# Patient Record
Sex: Male | Born: 1955 | Race: White | Hispanic: No | Marital: Married | Smoking: Never smoker
Health system: Southern US, Community
[De-identification: ages and names within clinical notes are randomized; demographics above are authoritative.]

---
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
## 2011-03-14 ENCOUNTER — Ambulatory Visit
Admission: RE | Admit: 2011-03-14 | Discharge: 2011-03-14 | Disposition: A | Discharge status: Home / Self Care | Payer: PRIVATE HEALTH INSURANCE | Source: Ambulatory Visit | Attending: Family Medicine | Admitting: Family Medicine

## 2011-03-14 ENCOUNTER — Ambulatory Visit (INDEPENDENT_AMBULATORY_CARE_PROVIDER_SITE_OTHER): Payer: PRIVATE HEALTH INSURANCE | Admitting: Family Medicine

## 2011-03-14 ENCOUNTER — Encounter: Payer: Self-pay | Admitting: Family Medicine

## 2011-03-14 ENCOUNTER — Telehealth: Payer: Self-pay | Admitting: Family Medicine

## 2011-03-14 VITALS — BP 144/82 | HR 96 | Temp 99.4°F | Ht 72.0 in | Wt 177.0 lb

## 2011-03-14 DIAGNOSIS — E1065 Type 1 diabetes mellitus with hyperglycemia: Secondary | ICD-10-CM

## 2011-03-14 DIAGNOSIS — E119 Type 2 diabetes mellitus without complications: Secondary | ICD-10-CM

## 2011-03-14 DIAGNOSIS — Z125 Encounter for screening for malignant neoplasm of prostate: Secondary | ICD-10-CM

## 2011-03-14 DIAGNOSIS — I10 Essential (primary) hypertension: Secondary | ICD-10-CM

## 2011-03-14 DIAGNOSIS — J189 Pneumonia, unspecified organism: Secondary | ICD-10-CM

## 2011-03-14 DIAGNOSIS — R05 Cough: Secondary | ICD-10-CM

## 2011-03-14 LAB — POCT UA - MICROALBUMIN
Albumin/Creatinine Ratio, Urine, POC: >300
Creatinine, POC: 100 mg/dL
Microalbumin Ur, POC: 150 mg/dL

## 2011-03-14 LAB — POCT GLYCOSYLATED HEMOGLOBIN (HGB A1C): Hemoglobin A1C: 11.7

## 2011-03-14 MED ORDER — INSULIN GLARGINE 100 UNIT/ML ~~LOC~~ SOLN: 10.0000 [IU] | Freq: Every day | SUBCUTANEOUS | Status: DC

## 2011-03-14 MED ORDER — INSULIN LISPRO 100 UNIT/ML ~~LOC~~ SOLN: SUBCUTANEOUS | Status: DC

## 2011-03-14 MED ORDER — DOXYCYCLINE HYCLATE 100 MG PO CAPS: 100.0000 mg | ORAL_CAPSULE | Freq: Two times a day (BID) | ORAL | Status: AC

## 2011-03-14 MED ORDER — INSULIN PEN NEEDLE 31G X 6 MM MISC: Status: DC

## 2011-03-14 NOTE — Patient Instructions (Addendum)
CXR downstairs today. Will call you w/ results tomorrow.  Update fasting labs tomorrow morning if you can.  They open at 8 and you do not need an appt.  Start Lantus 8 units every morning. Use Humalog for sliding scale.  Check sugars: AM fasting 80-120 is goal, before lunch and 2 hrs after dinner.  <150 goal at night.  Call me if any problems.  Return for f/u sugars in 2 wks.  I will start Lisinopril then.

## 2011-03-14 NOTE — Telephone Encounter (Signed)
I called pt with his CXR results, + for pneumonia.  Will treat with a round of Doxycline.  Call if any problems.  Recheck in 4 wks.

## 2011-03-14 NOTE — Progress Notes (Signed)
Subjective:    Patient ID: Troy Barnes, male    DOB: December 14, 1955, 55 y.o.   MRN: [id number]  HPI 55 yo WM presents for T1DM.  He stopped his insulin 3 yrs ago b/c he did not have insurance.  His sugar has been > 400.  He was diagnosed at 55 yrs old.  He was tried on oral meds at the onset but it didn't work.  He has been disoriented lately and coughing.  He has felt a bit nauseated.  Denies SOB or chest tightness.  Denies heart problems.  Does not smoke.  His father had an AMI at 57 and died.  He has polyuria and polydipsia.  He reports having an eye exam this year that was normal.  BP 144/82  Pulse 96  Temp(Src) 99.4 F (37.4 C) (Oral)  Ht 6' (1.829 m)  Wt 177 lb (80.287 kg)  BMI 24.01 kg/m2  SpO2 96%  Past Medical History  Diagnosis Date  . Diabetes mellitus     Type I since his 30s    Past Surgical History  Procedure Date  . Surgery scrotal / testicular 1975    for torsion    Family History  Problem Relation Age of Onset  . Diabetes Mother   . Heart disease Father     History   Social History  . Marital Status: Married    Spouse Name: Paul Dykes    Number of Children: 1  . Years of Education: 12   Occupational History  . manager     [employer]    Social History Main Topics  . Smoking status: Never Smoker   . Smokeless tobacco: Not on file  . Alcohol Use: No  . Drug Use: No  . Sexually Active: Yes   Other Topics Concern  . Not on file   Social History Narrative  . No narrative on file    Not on File  Current outpatient prescriptions:doxycycline (VIBRAMYCIN) 100 MG capsule, Take 1 capsule (100 mg total) by mouth 2 (two) times daily., Disp: 20 capsule, Rfl: 0;  insulin glargine (LANTUS SOLOSTAR) 100 UNIT/ML injection, Inject 10 Units into the skin daily., Disp: 15 mL, Rfl: 0;  insulin lispro (HUMALOG KWIKPEN) 100 UNIT/ML injection, Use for sliding scale insulin, Disp: 10 mL, Rfl: 12 Insulin Pen Needle (COMFORT EZ PEN NEEDLES) 31G X 6 MM MISC, Use 4 x a day  as directed, Disp: 100 each, Rfl: 3    Review of Systems  Constitutional: Positive for fatigue. Negative for fever and chills.  Respiratory: Positive for cough and wheezing.   Gastrointestinal: Positive for nausea. Negative for vomiting and diarrhea.  Genitourinary: Positive for frequency.  Neurological: Positive for weakness. Negative for headaches.  Psychiatric/Behavioral: Positive for confusion and decreased concentration.       Objective:   Physical Exam  Constitutional: He appears well-developed and well-nourished. No distress.  HENT:  Head: Normocephalic and atraumatic.  Right Ear: External ear normal.  Left Ear: External ear normal.  Nose: Nose normal.  Mouth/Throat: Oropharynx is clear and moist.  Eyes: Conjunctivae are normal. No scleral icterus.  Neck: Neck supple. No thyromegaly present.  Cardiovascular: Normal rate, regular rhythm and normal heart sounds.   No murmur heard. Pulmonary/Chest: Effort normal. No respiratory distress. He has rales (RUL and RLL rales with reduced air movement).  Abdominal: Soft. Bowel sounds are normal. He exhibits no distension. There is no tenderness. There is no guarding.  Musculoskeletal: He exhibits no edema.  Lymphadenopathy:    He  has no cervical adenopathy.  Neurological:       No tremor  Skin: Skin is warm and dry.  Psychiatric: He has a normal mood and affect.          Assessment & Plan:

## 2011-03-16 ENCOUNTER — Encounter: Payer: Self-pay | Admitting: Family Medicine

## 2011-03-16 DIAGNOSIS — IMO0002 Reserved for concepts with insufficient information to code with codable children: Secondary | ICD-10-CM | POA: Insufficient documentation

## 2011-03-16 DIAGNOSIS — J189 Pneumonia, unspecified organism: Secondary | ICD-10-CM | POA: Insufficient documentation

## 2011-03-16 DIAGNOSIS — E1065 Type 1 diabetes mellitus with hyperglycemia: Secondary | ICD-10-CM | POA: Insufficient documentation

## 2011-03-16 NOTE — Assessment & Plan Note (Signed)
Poorly controlled, off Insulin x 3 yrs.  Will restart Lantus at 8 units once a day (this was his latest RX dose).  Humalog given for sliding scale with a very sensitive scale given to pt.  Instructed on proper pen use.  He has a meter and test strips.  A1C high at 11 today.  Eye exam UTD but will get old records for vaccine hx.  Umicro HIGH and BP high but will wait for next visit to start an ACEi since he is a bit dehydrated today from polyuria and infection (PNA).  Monofilament next visit.  F/u in 2 wks.  Call if any problems.

## 2011-03-16 NOTE — Assessment & Plan Note (Signed)
R sided pneumonia confirmed on CXR likely the cause for his higher sugar readings, disorientation, cough and nausea.  Will treat him with 10 days of doxycyxline.  Repeat CXR in 3 wks.  RTC for f/u sugars in 2 wks.  Call if any problems, worsening breathing, fevers after 48 hrs.

## 2011-04-03 ENCOUNTER — Ambulatory Visit (INDEPENDENT_AMBULATORY_CARE_PROVIDER_SITE_OTHER): Payer: PRIVATE HEALTH INSURANCE | Admitting: Family Medicine

## 2011-04-03 ENCOUNTER — Encounter: Payer: Self-pay | Admitting: Family Medicine

## 2011-04-03 VITALS — BP 151/89 | HR 93 | Ht 72.0 in | Wt 184.0 lb

## 2011-04-03 DIAGNOSIS — I10 Essential (primary) hypertension: Secondary | ICD-10-CM

## 2011-04-03 DIAGNOSIS — E1065 Type 1 diabetes mellitus with hyperglycemia: Secondary | ICD-10-CM

## 2011-04-03 DIAGNOSIS — E1142 Type 2 diabetes mellitus with diabetic polyneuropathy: Secondary | ICD-10-CM

## 2011-04-03 DIAGNOSIS — E1140 Type 2 diabetes mellitus with diabetic neuropathy, unspecified: Secondary | ICD-10-CM

## 2011-04-03 DIAGNOSIS — E1149 Type 2 diabetes mellitus with other diabetic neurological complication: Secondary | ICD-10-CM

## 2011-04-03 DIAGNOSIS — J189 Pneumonia, unspecified organism: Secondary | ICD-10-CM

## 2011-04-03 MED ORDER — INSULIN GLARGINE 100 UNIT/ML ~~LOC~~ SOLN: 15.0000 [IU] | Freq: Every day | SUBCUTANEOUS | Status: DC

## 2011-04-03 MED ORDER — LISINOPRIL 20 MG PO TABS: 20.0000 mg | ORAL_TABLET | Freq: Every day | ORAL | Status: DC

## 2011-04-03 MED ORDER — INSULIN SYRINGES (DISPOSABLE) U-100 0.3 ML MISC: Status: DC

## 2011-04-03 MED ORDER — INSULIN LISPRO 100 UNIT/ML ~~LOC~~ SOLN: SUBCUTANEOUS | Status: DC

## 2011-04-03 NOTE — Patient Instructions (Signed)
See changes to insulin below.  Repeat CXR downstairs in 10 days.  Order printed. Will call you w/ results.  Return for f/u sugars in 1 month.

## 2011-04-03 NOTE — Progress Notes (Signed)
  Subjective:    Patient ID: Troy Barnes, male    DOB: [birth year], 55 y.o.   MRN: [id number]  HPI  55 yo WM presents for f/u visit.  He had pneumonia at his last visit.  He was put on antibiotics and is starting to feel better.  He had vomitting for the first 3 days.  The then had to fly to [place] to visit his mom who is sick. He is back on insulin.  He is on Lantus 8 units in the morning.  He is on Humalog with breakfast, lunch and dinner (3 units with meals).  His blurry vision is much better.  His sugars are down into the 200s to 300s.  He eats 2, sometimes 3 meals/ day.  No lows. He had just had an eye exam done.  He has had numbness and tingling in both legs for years.  Denies any lows.  He is not taking anything for his BP and has not yet done his labs.  BP 151/89  Pulse 93  Ht 6' (1.829 m)  Wt 184 lb (83.462 kg)  BMI 24.95 kg/m2  SpO2 98%   Review of Systems  Constitutional: Negative for fever, fatigue and unexpected weight change.  Eyes: Negative for visual disturbance.  Respiratory: Negative for cough, chest tightness and shortness of breath.   Cardiovascular: Negative for chest pain and palpitations.  Gastrointestinal: Negative for nausea and abdominal pain.  Genitourinary: Positive for frequency.  Neurological: Negative for headaches.       Objective:   Physical Exam  Constitutional: He appears well-developed and well-nourished.  HENT:  Mouth/Throat: Oropharynx is clear and moist.       Poor dentition  Eyes: Conjunctivae are normal.  Cardiovascular: Normal rate, regular rhythm and normal heart sounds.   Pulmonary/Chest: Effort normal and breath sounds normal. No respiratory distress. He has no wheezes. He has no rales.  Musculoskeletal: He exhibits no edema.  Lymphadenopathy:    He has no cervical adenopathy.  Skin: Skin is warm and dry.  Psychiatric: He has a normal mood and affect.          Assessment & Plan:

## 2011-04-04 DIAGNOSIS — E1140 Type 2 diabetes mellitus with diabetic neuropathy, unspecified: Secondary | ICD-10-CM | POA: Insufficient documentation

## 2011-04-04 DIAGNOSIS — I10 Essential (primary) hypertension: Secondary | ICD-10-CM | POA: Insufficient documentation

## 2011-04-04 NOTE — Assessment & Plan Note (Signed)
Clinically much improved with much improved breath sounds on exam.  Will repeat his CXR in 10 days, order given.

## 2011-04-04 NOTE — Assessment & Plan Note (Signed)
Poor control by home sugars and last A1C, complicated by neuropathy (failed his 10 g monofilament  bilat today).  Will increase his Lantus from 8--> 15 units once daily and go up on Humalog from 3--> 6 units 3 x a day with meals.  encoiuraged regular meals w/o skipping, staying with low carb/ low sugar.  Expect some of his high readings to improve with resolution of pneumonia.  RTC in 1 month.  Call if any readings < 70 or > 400.

## 2011-04-04 NOTE — Assessment & Plan Note (Signed)
BP has been consistently > 140/90.  Will start him on Lisinopril daily.  Update labs ( he has the order).  Plan to repeat his K+/ renal function after [redacted] wks along with a urine microalbumin.  Call if any problems.

## 2011-04-14 ENCOUNTER — Ambulatory Visit
Admission: RE | Admit: 2011-04-14 | Discharge: 2011-04-14 | Disposition: A | Discharge status: Home / Self Care | Payer: PRIVATE HEALTH INSURANCE | Source: Ambulatory Visit | Attending: Family Medicine | Admitting: Family Medicine

## 2011-04-14 ENCOUNTER — Telehealth: Payer: Self-pay | Admitting: Family Medicine

## 2011-04-14 DIAGNOSIS — J189 Pneumonia, unspecified organism: Secondary | ICD-10-CM

## 2011-04-14 NOTE — Telephone Encounter (Signed)
Pt aware of the above  

## 2011-04-14 NOTE — Telephone Encounter (Signed)
Pls let pt know that his Pneumonia has cleared.  He does have a sign of chronic bronchitis.  If he continue to have cough or SOB, i suggest a pulmonology referral.

## 2011-05-03 ENCOUNTER — Ambulatory Visit: Payer: PRIVATE HEALTH INSURANCE | Admitting: Family Medicine

## 2011-08-29 ENCOUNTER — Telehealth: Payer: Self-pay | Admitting: *Deleted

## 2011-08-29 NOTE — Telephone Encounter (Signed)
Need rx for 4 month supply on Humalog for this patient for pt assistance program and 3 month supply for the Lantus pens. I dont know how to figure out quantity for the insulin to last that long. Just print so I can put with papers

## 2011-08-30 MED ORDER — INSULIN GLARGINE 100 UNIT/ML ~~LOC~~ SOLN: 15.0000 [IU] | Freq: Every day | SUBCUTANEOUS | Status: DC

## 2011-08-30 MED ORDER — INSULIN LISPRO 100 UNIT/ML ~~LOC~~ SOLN: SUBCUTANEOUS | Status: DC

## 2011-08-30 NOTE — Telephone Encounter (Signed)
Order printed

## 2011-09-06 ENCOUNTER — Other Ambulatory Visit: Payer: Self-pay | Admitting: Family Medicine

## 2011-09-06 MED ORDER — INSULIN GLARGINE 100 UNIT/ML ~~LOC~~ SOLN: 15.0000 [IU] | Freq: Every day | SUBCUTANEOUS | Status: DC

## 2011-09-06 MED ORDER — INSULIN LISPRO 100 UNIT/ML ~~LOC~~ SOLN: SUBCUTANEOUS | Status: DC

## 2011-10-12 ENCOUNTER — Ambulatory Visit (INDEPENDENT_AMBULATORY_CARE_PROVIDER_SITE_OTHER): Payer: PRIVATE HEALTH INSURANCE | Admitting: Family Medicine

## 2011-10-12 ENCOUNTER — Encounter: Payer: Self-pay | Admitting: Family Medicine

## 2011-10-12 DIAGNOSIS — K2970 Gastritis, unspecified, without bleeding: Secondary | ICD-10-CM

## 2011-10-12 DIAGNOSIS — K2990 Gastroduodenitis, unspecified, without bleeding: Secondary | ICD-10-CM

## 2011-10-12 DIAGNOSIS — I10 Essential (primary) hypertension: Secondary | ICD-10-CM

## 2011-10-12 DIAGNOSIS — E119 Type 2 diabetes mellitus without complications: Secondary | ICD-10-CM

## 2011-10-12 MED ORDER — LISINOPRIL 20 MG PO TABS: 20.0000 mg | ORAL_TABLET | Freq: Every day | ORAL | Status: DC

## 2011-10-12 NOTE — Progress Notes (Signed)
  Subjective:    Patient ID: Troy Barnes, male    DOB: 1955/11/25, 56 y.o.   MRN: [id number]  HPI Having dry heaves and feeling nauseated.  Felt like this back over the summer and was dx with DM. Then ran out of insurance. Since then has been laid off.  Has been off his insulin for awhile.  No fever. Sugars running in 400s.  No cough or SOB.  Dry heaves are worse in the AM.  Still able to eat.  Ankles have been swollen as well to his thighs.  Says always has some swelling but this is more than usual. No change in bowels or blood in teh stool. Wife is here with hime today.    Review of Systems     Objective:   Physical Exam  Constitutional: He is oriented to person, place, and time. He appears well-developed and well-nourished.  HENT:  Head: Normocephalic and atraumatic.  Right Ear: External ear normal.  Left Ear: External ear normal.  Nose: Nose normal.  Mouth/Throat: Oropharynx is clear and moist.       TMs and canals are clear.   Eyes: Conjunctivae and EOM are normal. Pupils are equal, round, and reactive to light.  Neck: Neck supple. No thyromegaly present.  Cardiovascular: Normal rate, regular rhythm and normal heart sounds.   Pulmonary/Chest: Effort normal and breath sounds normal.  Abdominal: Soft. Bowel sounds are normal. He exhibits no distension and no mass. There is no tenderness. There is no rebound and no guarding.  Musculoskeletal:       Bilat 1+ ankle edema.   Lymphadenopathy:    He has no cervical adenopathy.  Neurological: He is alert and oriented to person, place, and time.  Skin: Skin is warm and dry.  Psychiatric: He has a normal mood and affect. His behavior is normal.   Toenails are thick, long and dystrophic.       Assessment & Plan:  DM- Will give insulin samples.  We discussed long term complications of not treating his DM.  He has already applied to the drug company for drug assitance. He really needs a CMP and lipid panel. This exam was performed  today. He is in great need of podiatry for his nails as they are too long and very dystrophic but unfortunately patient does not have insurance right now.  HTN- Restart lisinopril. On $4 list at [facility]. Will send there.  Discussed low salt diet. He says never dx with HTN before. Did eat chinese food last night.   Gastriltis - Will start PPI. Discussed reflux hygiene. Stop sodas.  Given H.O. Call if not better in 2 weeks.

## 2011-10-12 NOTE — Patient Instructions (Signed)
Gastroesophageal Reflux Disease, Adult Gastroesophageal reflux disease (GERD) happens when acid from your stomach flows up into the esophagus. When acid comes in contact with the esophagus, the acid causes soreness (inflammation) in the esophagus. Over time, GERD may create small holes (ulcers) in the lining of the esophagus. CAUSES    Increased body weight. This puts pressure on the stomach, making acid rise from the stomach into the esophagus.     Smoking. This increases acid production in the stomach.     Drinking alcohol. This causes decreased pressure in the lower esophageal sphincter (valve or ring of muscle between the esophagus and stomach), allowing acid from the stomach into the esophagus.     Late evening meals and a full stomach. This increases pressure and acid production in the stomach.     A malformed lower esophageal sphincter.  Sometimes, no cause is found. SYMPTOMS    Burning pain in the lower part of the mid-chest behind the breastbone and in the mid-stomach area. This may occur twice a week or more often.     Trouble swallowing.     Sore throat.     Dry cough.     Asthma-like symptoms including chest tightness, shortness of breath, or wheezing.  DIAGNOSIS   Your caregiver may be able to diagnose GERD based on your symptoms. In some cases, X-rays and other tests may be done to check for complications or to check the condition of your stomach and esophagus. TREATMENT   Your caregiver may recommend over-the-counter or prescription medicines to help decrease acid production. Ask your caregiver before starting or adding any new medicines.   HOME CARE INSTRUCTIONS    Change the factors that you can control. Ask your caregiver for guidance concerning weight loss, quitting smoking, and alcohol consumption.     Avoid foods and drinks that make your symptoms worse, such as:     Caffeine or alcoholic drinks.     Chocolate.    Peppermint or mint flavorings.     Garlic  and onions.     Spicy foods.     Citrus fruits, such as oranges, lemons, or limes.     Tomato-based foods such as sauce, chili, salsa, and pizza.     Fried and fatty foods.     Avoid lying down for the 3 hours prior to your bedtime or prior to taking a nap.     Eat small, frequent meals instead of large meals.     Wear loose-fitting clothing. Do not wear anything tight around your waist that causes pressure on your stomach.     Raise the head of your bed 6 to 8 inches with wood blocks to help you sleep. Extra pillows will not help.     Only take over-the-counter or prescription medicines for pain, discomfort, or fever as directed by your caregiver.     Do not take aspirin, ibuprofen, or other nonsteroidal anti-inflammatory drugs (NSAIDs).  SEEK IMMEDIATE MEDICAL CARE IF:    You have pain in your arms, neck, jaw, teeth, or back.     Your pain increases or changes in intensity or duration.     You develop nausea, vomiting, or sweating (diaphoresis).     You develop shortness of breath, or you faint.     Your vomit is green, yellow, black, or looks like coffee grounds or blood.     Your stool is red, bloody, or black.  These symptoms could be signs of other problems, such as heart  disease, gastric bleeding, or esophageal bleeding. MAKE SURE YOU:    Understand these instructions.     Will watch your condition.     Will get help right away if you are not doing well or get worse.  Document Released: 07/05/2005 Document Revised: 06/07/2011 Document Reviewed: 04/14/2011 [employer] Patient Information 2012 [employer].1.5 Gram Low Sodium Diet A 1.5 gram sodium diet restricts the amount of sodium in the diet to no more than 1.5 g or 1500 mg daily. The American Heart Association recommends Americans over the age of 30 to consume no more than 1500 mg of sodium each day to reduce the risk of developing high blood pressure. Research also shows that limiting sodium may reduce heart  attack and stroke risk. Many foods contain sodium for flavor and sometimes as a preservative. When the amount of sodium in a diet needs to be low, it is important to know what to look for when choosing foods and drinks. The following includes some information and guidelines to help make it easier for you to adapt to a low sodium diet. QUICK TIPS  Do not add salt to food.     Avoid convenience items and fast food.     Choose unsalted snack foods.     Buy lower sodium products, often labeled as "lower sodium" or "no salt added."     Check food labels to learn how much sodium is in 1 serving.     When eating at a restaurant, ask that your food be prepared with less salt or none, if possible.  READING FOOD LABELS FOR SODIUM INFORMATION The nutrition facts label is a good place to find how much sodium is in foods. Look for products with no more than 400 mg of sodium per serving. Remember that 1.5 g = 1500 mg. The food label may also list foods as:  Sodium-free: Less than 5 mg in a serving.     Very low sodium: 35 mg or less in a serving.     Low-sodium: 140 mg or less in a serving.     Light in sodium: 50% less sodium in a serving. For example, if a food that usually has 300 mg of sodium is changed to become light in sodium, it will have 150 mg of sodium.     Reduced sodium: 25% less sodium in a serving. For example, if a food that usually has 400 mg of sodium is changed to reduced sodium, it will have 300 mg of sodium.  CHOOSING FOODS Grains  Avoid: Salted crackers and snack items. Some cereals, including instant hot cereals. Bread stuffing and biscuit mixes. Seasoned rice or pasta mixes.     Choose: Unsalted snack items. Low-sodium cereals, oats, puffed wheat and rice, shredded wheat. English muffins and bread. Pasta.  Meats  Avoid: Salted, canned, smoked, spiced, pickled meats, including fish and poultry. Bacon, ham, sausage, cold cuts, hot dogs, anchovies.     Choose: Low-sodium  canned tuna and salmon. Fresh or frozen meat, poultry, and fish.  Dairy  Avoid: Processed cheese and spreads. Cottage cheese. Buttermilk and condensed milk. Regular cheese.     Choose: Milk. Low-sodium cottage cheese. Yogurt. Sour cream. Low-sodium cheese.  Fruits and Vegetables  Avoid: Regular canned vegetables. Regular canned tomato sauce and paste. Frozen vegetables in sauces. Olives. Rosita Fire. Relishes. Sauerkraut.     Choose: Low-sodium canned vegetables. Low-sodium tomato sauce and paste. Frozen or fresh vegetables. Fresh and frozen fruit.  Condiments  Avoid: Canned and packaged  gravies. Worcestershire sauce. Tartar sauce. Barbecue sauce. Soy sauce. Steak sauce. Ketchup. Onion, garlic, and table salt. Meat flavorings and tenderizers.     Choose: Fresh and dried herbs and spices. Low-sodium varieties of mustard and ketchup. Lemon juice. Tabasco sauce. Horseradish.  SAMPLE 1.5 GRAM SODIUM MEAL PLAN Breakfast / Sodium (mg)  1 cup low-fat milk / 143 mg     1 whole-wheat English muffin / 240 mg     1 tbs heart-healthy margarine / 153 mg     1 hard-boiled egg / 139 mg     1 small orange / 0 mg  Lunch / Sodium (mg)  1 cup raw carrots / 76 mg     2 tbs no salt added peanut butter / 5 mg     2 slices whole-wheat bread / 270 mg     1 tbs jelly / 6 mg      cup red grapes / 2 mg  Dinner / Sodium (mg)  1 cup whole-wheat pasta / 2 mg     1 cup low-sodium tomato sauce / 73 mg     3 oz lean ground beef / 57 mg     1 small side salad (1 cup raw spinach leaves,  cup cucumber,  cup yellow bell pepper) with 1 tsp olive oil and 1 tsp red wine vinegar / 25 mg  Snack / Sodium (mg)  1 container low-fat vanilla yogurt / 107 mg     3 graham cracker squares / 127 mg  Nutrient Analysis  Calories: 1745     Protein: 75 g     Carbohydrate: 237 g     Fat: 57 g     Sodium: 1425 mg  Document Released: 09/25/2005 Document Revised: 06/07/2011 Document Reviewed:  12/27/2009 [facility] Patient Information 2012 [place], [place].

## 2011-11-14 ENCOUNTER — Ambulatory Visit (INDEPENDENT_AMBULATORY_CARE_PROVIDER_SITE_OTHER): Payer: PRIVATE HEALTH INSURANCE | Admitting: Physician Assistant

## 2011-11-14 ENCOUNTER — Encounter: Payer: Self-pay | Admitting: Physician Assistant

## 2011-11-14 VITALS — BP 172/89 | HR 63 | Wt 204.0 lb

## 2011-11-14 DIAGNOSIS — I10 Essential (primary) hypertension: Secondary | ICD-10-CM

## 2011-11-14 DIAGNOSIS — E119 Type 2 diabetes mellitus without complications: Secondary | ICD-10-CM

## 2011-11-14 DIAGNOSIS — R600 Localized edema: Secondary | ICD-10-CM

## 2011-11-14 DIAGNOSIS — R609 Edema, unspecified: Secondary | ICD-10-CM

## 2011-11-14 MED ORDER — INSULIN ASPART 100 UNIT/ML ~~LOC~~ SOLN: 4.0000 [IU] | Freq: Three times a day (TID) | SUBCUTANEOUS | Status: DC

## 2011-11-14 MED ORDER — LISINOPRIL-HYDROCHLOROTHIAZIDE 20-12.5 MG PO TABS: 1.0000 | ORAL_TABLET | Freq: Two times a day (BID) | ORAL | Status: DC

## 2011-11-14 NOTE — Progress Notes (Signed)
  Subjective:    Patient ID: Troy Barnes, male    DOB: 1956/03/31, 56 y.o.   MRN: [id number]  HPI Patient presents to clinic complaining of legs swelling. He has noticed this swelling before but it has recently(over the past 2 weeks) gotten worse. He reports that both legs all the way up to his groin feel tight. Last week he could not bend his legs or put on his pants. He had read that humolog can cause swelling so on Friday he stopped his humulog. He does report swelling did get better. Since Friday he has only been on Lantus 15 units at night. He reports his sugars have not changed much and were tested in the 240's before he ate and 384 after he ate. Denies ever experiencing any lows. He has taken lisinopril daily since prescribed last visit. Blood pressure has decreased some but is still elevated today. He has also been having headaches and just not feeling well.   Patient does not have insurance. He did not get blood work done because he can't afford it. He has a potential opportunity for a job very soon. He states he will get blood work when he has insurance.      Review of Systems       Objective:   Physical Exam  Constitutional: He is oriented to person, place, and time. He appears well-developed and well-nourished.  HENT:  Head: Normocephalic and atraumatic.  Cardiovascular: Normal rate, regular rhythm, normal heart sounds and intact distal pulses.   Pulmonary/Chest: Effort normal and breath sounds normal. He has no wheezes.  Neurological: He is alert and oriented to person, place, and time.  Skin:       2+pitting edema bilateral legs up to knees. No erythema or tenderness. Pedal pulses intact bilaterally.   Psychiatric: He has a normal mood and affect. His behavior is normal.          Assessment & Plan:  Bilateral leg swelling- Started him on Lisinopil-HCTZ combo 20/12.5 BID to help with blood pressure and fluid retention. Need to check K+ and Cr. Patient states in March at  follow up he will do labs.   Diabetes- Changed Humolog to Novolog 4 units before each meal. Continue on Lantus 15 mg at bedtime. Encouraged patient to check sugar in the morning fasting and 2 hours after both meals and before bedtime. Keep log and bring to office in 2 weeks. Need to recheck HgA1C in March.   HTN- Started to Lisinopril/HCTZ BID. Encouraged patient to go to pharmacy and check blood pressure a couple times a week and record it. Will recheck in March.   No insurance. Denied pneumo shot and colonoscopy at this time. Gave sample of novolog.

## 2011-11-14 NOTE — Patient Instructions (Addendum)
Start Novolog 4 units at meal time. Continue lantus 15 units at bedtime. Start lisinopril/hctz 20/12/5 twice a day for bp control and swelling. Test blood glucose fasting and 2 hrs after every meal and at bedtime. Please record and bring to office. Check blood pressure and record and also bring in. Follow in March.

## 2012-01-16 IMAGING — CR DG CHEST 2V
2 series · 2 of 2 positions shown · non-contrast
Comparison: [HOSPITAL] [HOSPITAL] chest x-ray 03/14/2011.

CLINICAL DATA: Follow up pneumonia, nonsmoker, cough.

CHEST - 2 VIEW

[view not recorded (1 of 2)]
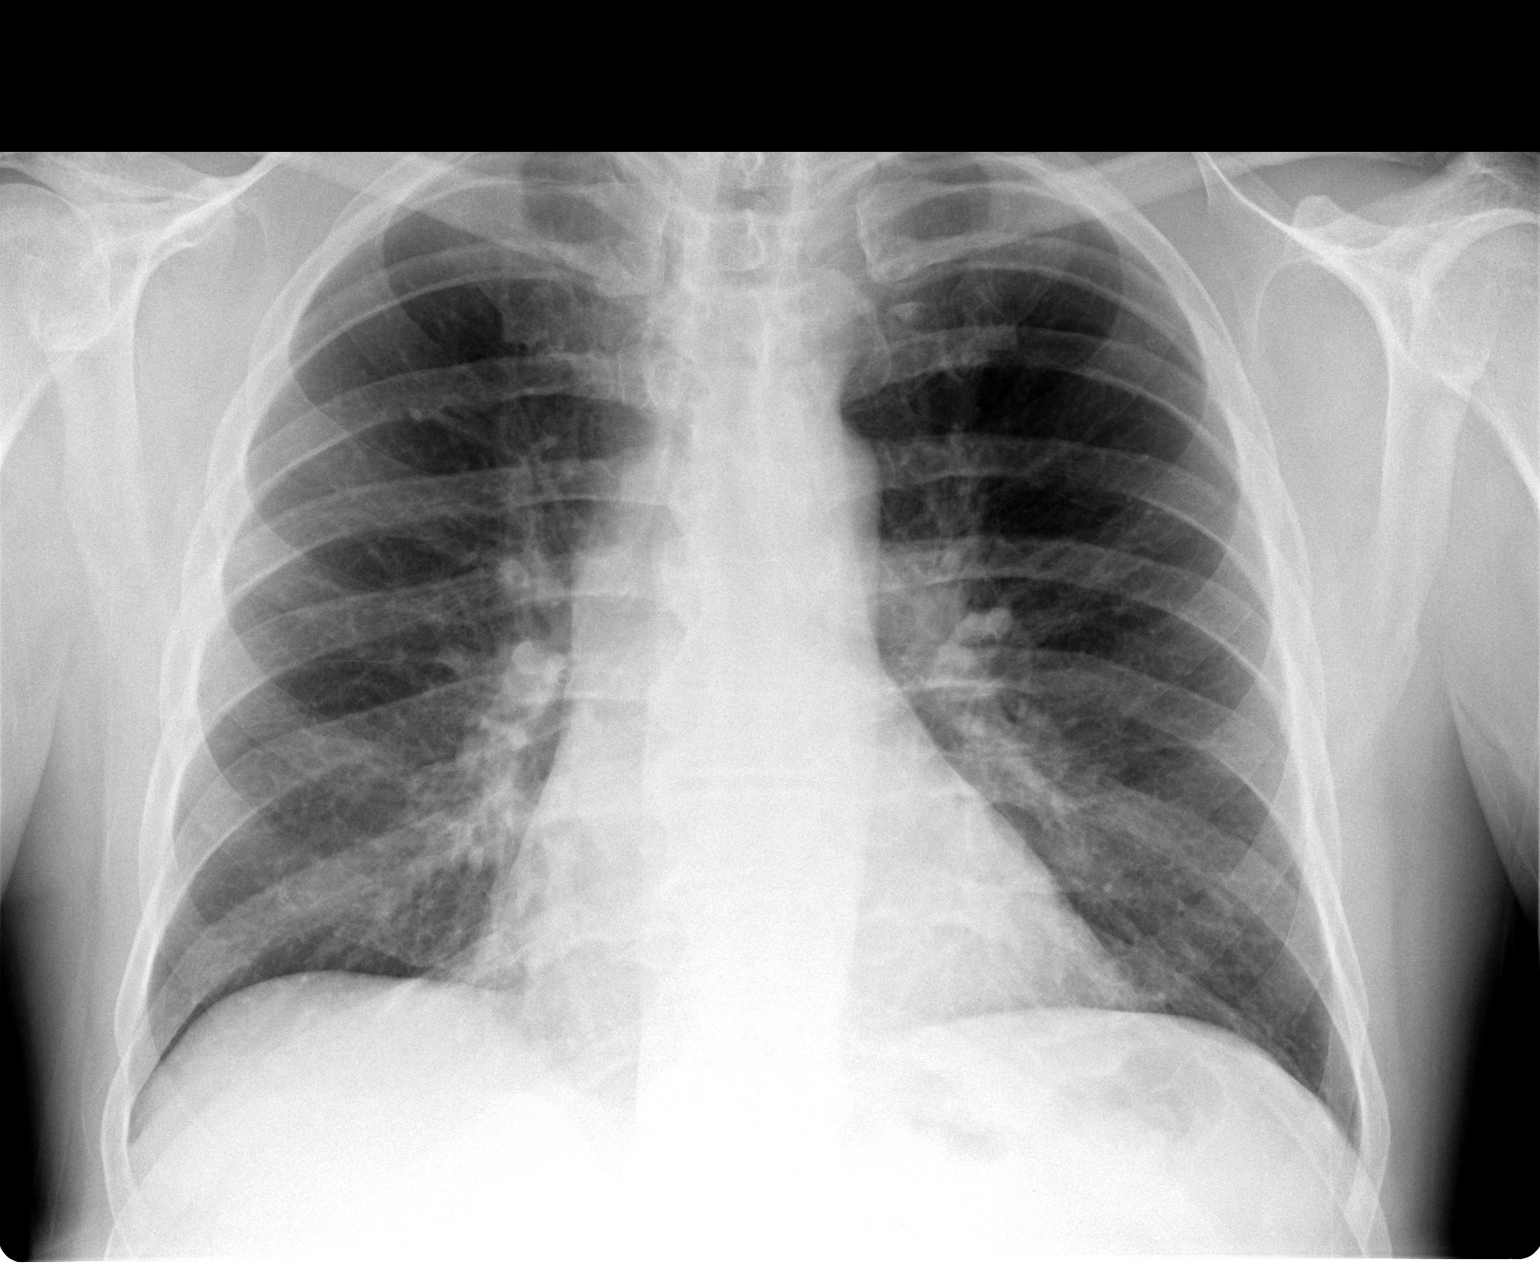

[view not recorded (2 of 2)]
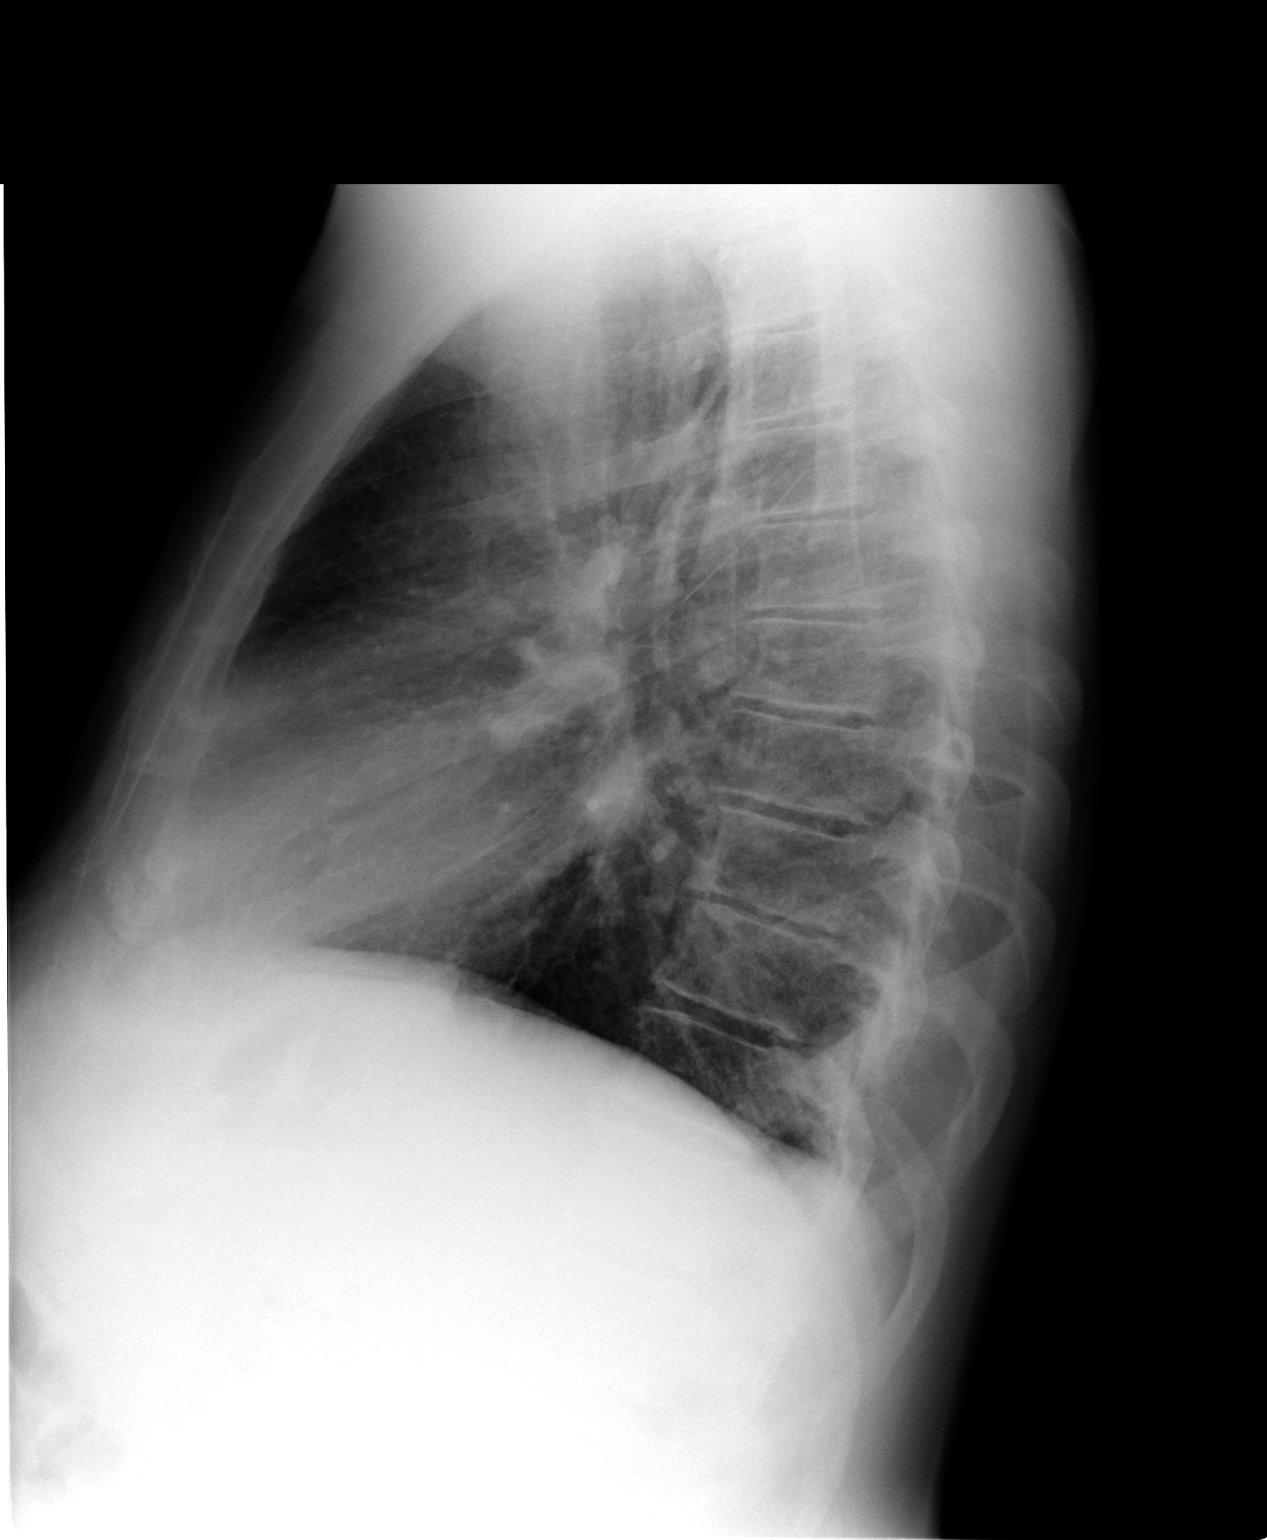

[2 of 2 positions shown; findings below may reference images not displayed]

FINDINGS: Since 03/14/2011, complete clearing previous right upper
lung infiltrate.

Stable slight central airway thickening seen with lungs otherwise
clear.

Heart size normal.

Mediastinum, hila, pleura and osseous structures are stable and
unremarkable.
IMPRESSION: 1.  Interval clearing previous right upper lung pneumonia.
2.  Stable slight central airway thickening of asthma or chronic
bronchitis.
3.  No active disease.

## 2012-06-19 ENCOUNTER — Ambulatory Visit (INDEPENDENT_AMBULATORY_CARE_PROVIDER_SITE_OTHER): Payer: PRIVATE HEALTH INSURANCE | Admitting: Physician Assistant

## 2012-06-19 ENCOUNTER — Encounter: Payer: Self-pay | Admitting: Physician Assistant

## 2012-06-19 VITALS — BP 178/94 | HR 92 | Ht 72.0 in | Wt 213.0 lb

## 2012-06-19 DIAGNOSIS — I10 Essential (primary) hypertension: Secondary | ICD-10-CM

## 2012-06-19 DIAGNOSIS — E119 Type 2 diabetes mellitus without complications: Secondary | ICD-10-CM

## 2012-06-19 LAB — POCT URINALYSIS DIPSTICK
Glucose, UA: 500
Spec Grav, UA: 1.025
Urobilinogen, UA: 0.2

## 2012-06-19 LAB — COMPREHENSIVE METABOLIC PANEL
ALT: 32 U/L (ref 0–53)
Alkaline Phosphatase: 163 U/L — ABNORMAL HIGH (ref 39–117)
CO2: 22 mEq/L (ref 19–32)
Sodium: 137 mEq/L (ref 135–145)
Total Bilirubin: 0.4 mg/dL (ref 0.3–1.2)
Total Protein: 5.2 g/dL — ABNORMAL LOW (ref 6.0–8.3)

## 2012-06-19 LAB — POCT GLYCOSYLATED HEMOGLOBIN (HGB A1C): Hemoglobin A1C: 11.2

## 2012-06-19 NOTE — Progress Notes (Signed)
Subjective:    Patient ID: Troy Barnes, male    DOB: 12-Sep-1956, 56 y.o.   MRN: [id number]  HPI Patient is a 56 year old male who presents to the clinic with diabetes and hypertension. He has not been seen in the office in over her 6 months. He lost his job and being and has not been taking any of his diabetic medications for her blood pressure medications due to cost. When questioned if he could afford $4 lisinopril/HCTZ he said there is absolutely no money for medications. Patient is not in a good state today. His vision is increasingly worsening in both eyes. He sees black dots continually. He has bilateral leg edema. He denies any chest pain, palpitations, shortness of breath. He is still able to walk but does not have any filling in his feet. He has been trying to not eat any sugary foods. He denies any urinary urgency or frequency.   Review of Systems     Objective:   Physical Exam  Constitutional: He is oriented to person, place, and time. He appears well-developed and well-nourished.  HENT:  Head: Normocephalic and atraumatic.       Poor dentition.  Neck: Normal range of motion. Neck supple.  Cardiovascular: Normal rate, regular rhythm, normal heart sounds and intact distal pulses.   Pulmonary/Chest: Effort normal and breath sounds normal. He has no wheezes.  Neurological: He is alert and oriented to person, place, and time.       He was not able to fill monofilament test in any position. No sores or lesions were found on his feet.  Skin:       Bilateral 2+ pitting edema up to knee.  Psychiatric:       He was very down today. While discussing prognosis and what he needed to do he became tearful.          Assessment & Plan:  Diabetes-UA positive for blood, protein, glucose. Hemoglobin A1c was 11.3. A1c is actually down from last recheck. He had a long discussion about the importance of controlling his blood glucose. We talked about the effects of elevated glucose such as  blindness, kidney failure, though your sores to heal. I know that financially he is going through a rough time. I encouraged him to try to make an appointment with the free clinic such as [facility]. I'm aware that there is a waiting list to get into with [facility] quite a; therefore, we will work with him here with samples and trying to get drug assistance from the pharmaceutical companies. Patient was told to present off forms for Lantus and NovoLog refill on his part and we would fill out our part. I told him to start Lantus 20 units at bedtime and then I gave him a sliding-scale for rapid insulin. Reminded him to check his fasting sugar in the morning and then before each meals. 3 strips were given to patient along with samples of Lantus and NovoLog. I need to see him back in 3 months to see the progress. If he can get in with [facility] that would be on some and they can manage his diabetes from there. Encouraged him to go to ophthalmologist to have his eyes examined.  Hypertension-I gave patient samples of Benicar HCT 40/12.5 to take daily. I need him to check his blood pressure at local pharmacy and call in with number in the next 2 weeks to make sure he is at an adequate dose. I encouraged  him if he could find a way to afford a $4 prescription for lisinopril to do that. Lisinopril HCTZ has been working for him in the past.

## 2012-06-19 NOTE — Patient Instructions (Signed)
Gave patient sliding scale insulin reminders. Patient is discharged 20 units of Lantus every night and rapid acting insulin before each male. Check fasting sugars and before each male. Start Benicar/HCT daily. Followup in 2 weeks with blood pressure reading. Call [facility] [place] and see if he can be seen there.

## 2012-07-08 ENCOUNTER — Ambulatory Visit (INDEPENDENT_AMBULATORY_CARE_PROVIDER_SITE_OTHER): Payer: Self-pay | Admitting: Physician Assistant

## 2012-07-08 ENCOUNTER — Encounter: Payer: Self-pay | Admitting: Physician Assistant

## 2012-07-08 VITALS — BP 182/91 | HR 89 | Ht 72.0 in | Wt 214.0 lb

## 2012-07-08 DIAGNOSIS — R609 Edema, unspecified: Secondary | ICD-10-CM

## 2012-07-08 DIAGNOSIS — E1065 Type 1 diabetes mellitus with hyperglycemia: Secondary | ICD-10-CM

## 2012-07-08 DIAGNOSIS — I10 Essential (primary) hypertension: Secondary | ICD-10-CM

## 2012-07-08 NOTE — Patient Instructions (Addendum)
[facility] [phone]. You need some assistance with your health care.   Please feel out your paperwork to hopefully get assistance with medication. Please bring in paperwork and we will fax it.

## 2012-07-08 NOTE — Progress Notes (Signed)
  Subjective:    Patient ID: Troy Barnes, male    DOB: 09/19/1956, 56 y.o.   MRN: [id number]  HPI Patient presents to the clinic to follow up on diabetes and hypertension. At last visit I gave him sample of Lantus and Novolog to use before bed and with sliding scale insulin. His sugars are much better they have been running in the 110 fasting. He did use sample of Benicar but ran out and then used old left over blood pressure medication. Denies any CP, palpitations, SOB. His leg continue to swell. Worse at night and better in the morning. He does try to watch what he eats. His vision is stable but still cannot see very far and things are very fuzzy. [facility] or been seen by eye doc.    Review of Systems     Objective:   Physical Exam  Constitutional: He is oriented to person, place, and time. He appears well-developed and well-nourished.  HENT:  Head: Normocephalic and atraumatic.  Cardiovascular: Normal rate and normal heart sounds.   Pulmonary/Chest: Effort normal and breath sounds normal.  Neurological: He is alert and oriented to person, place, and time.  Skin:       Bilateral lower leg edema 2+. No skin changes or decomposition. No erythema or color changes.   Psychiatric: He has a normal mood and affect. His behavior is normal.          Assessment & Plan:  Diabetes type I- Pt has enough lantus but did give more novolog. Encourage patient to call [facility] today. Gave him number. Also gave paperwork to get approved for financial assistance all he needs to do is get tax records. Keep watching your diet. Encouraged him to find a opthalmologic to have eyes evaluated. Denies all vaccines due to cost.  HTN/edema- Gave samples of Benicar 40/25. Gave form to provide [place] with info to send to company for assistance. BP elevated today before we request meds need to see if gets BP lower. BP recheck with nurse visit in 2 weeks. Hopefully the increase in  diuretic will help with swelling. Educated patient about elevation of feet and compression. He could get OTC compression stockings.

## 2012-07-15 ENCOUNTER — Ambulatory Visit: Payer: Self-pay

## 2012-07-15 ENCOUNTER — Encounter: Payer: Self-pay | Admitting: Physician Assistant

## 2012-07-15 ENCOUNTER — Ambulatory Visit (INDEPENDENT_AMBULATORY_CARE_PROVIDER_SITE_OTHER): Payer: Self-pay | Admitting: Physician Assistant

## 2012-07-15 VITALS — BP 185/95 | HR 90 | Wt 227.0 lb

## 2012-07-15 DIAGNOSIS — I10 Essential (primary) hypertension: Secondary | ICD-10-CM

## 2012-07-15 DIAGNOSIS — Z23 Encounter for immunization: Secondary | ICD-10-CM

## 2012-07-15 DIAGNOSIS — R609 Edema, unspecified: Secondary | ICD-10-CM

## 2012-07-15 MED ORDER — FUROSEMIDE 20 MG PO TABS: 20.0000 mg | ORAL_TABLET | Freq: Every day | ORAL | Status: DC

## 2012-07-15 MED ORDER — METOPROLOL TARTRATE 25 MG PO TABS: 25.0000 mg | ORAL_TABLET | Freq: Two times a day (BID) | ORAL | Status: DC

## 2012-07-15 MED ORDER — OLMESARTAN MEDOXOMIL 40 MG PO TABS: 40.0000 mg | ORAL_TABLET | Freq: Every day | ORAL | Status: DC

## 2012-07-15 NOTE — Patient Instructions (Addendum)
Lasix for edema.  Metoprolol for Blood pressure.   Follow up in 2 weeks.

## 2012-07-15 NOTE — Progress Notes (Signed)
  Subjective:    Patient ID: Troy Barnes, male    DOB: [birth year], 56 y.o.   MRN: [id number]  HPI   Pt denies chest pain, SOB, dizziness, or heart palpitations.  Taking meds as directed w/o problems.  Denies medication side effects.still retaining fluid.  5 min spent with pt.  Patient came in for blood pressure recheck; however, blood pressure still remains very elevated today despite patient compliance on Benicar HCT 40/25. He does deny any cardiac symptoms. His legs continue to swell bilaterally. He reports he elevating them as much as he can but he denies been able to get him into compression stockings. He does report that his sugars are much better following the sliding scale insulin. His morning fasting sugar was 102 this pain. He is unable to walk long distances due to leg swelling and heaviness.    Review of Systems     Objective:   Physical Exam  Constitutional: He is oriented to person, place, and time. He appears well-developed and well-nourished.  HENT:  Head: Normocephalic and atraumatic.  Cardiovascular: Normal rate, regular rhythm, normal heart sounds and intact distal pulses.   Pulmonary/Chest: Effort normal and breath sounds normal. He has no wheezes.  Neurological: He is alert and oriented to person, place, and time.  Skin:       Bilateral lower leg edema 2+ pitting edema from ankles to mid calf. Some erythema on anterior legs with some scaling.   Psychiatric: He has a normal mood and affect. His behavior is normal.          Assessment & Plan:  Bilateral lower leg edema-I. did give Lasix 20 mg to take daily. Discussed with patient if he cannot use compression stockings to get ace bandages and a wrap leg elevated as much as possible.  Hypertension-we'll continue to give patient samples of Benicar and not Benicar/HCT since we are adding Lasix and we'll add metoprolol 25 mg twice a day. Will recheck in 1 week blood pressure to see if decreasing any. Will consider  Tribenzor if not improving.  Did fill out DMV handicap form so he could get parking sticker.  Flu shot given today.

## 2012-07-17 ENCOUNTER — Telehealth: Payer: Self-pay | Admitting: Physician Assistant

## 2012-07-17 NOTE — Telephone Encounter (Signed)
Please call patient and find out if swelling is improved.

## 2012-07-17 NOTE — Telephone Encounter (Signed)
Will recheck next week may consider labs or changing bP meds again if BP not improving.

## 2012-07-17 NOTE — Telephone Encounter (Signed)
Pt states swelling is a little bit better but still very swollen.

## 2012-07-20 NOTE — Progress Notes (Signed)
This encounter was created in error - please disregard.

## 2012-07-29 ENCOUNTER — Ambulatory Visit (INDEPENDENT_AMBULATORY_CARE_PROVIDER_SITE_OTHER): Payer: Self-pay | Admitting: Physician Assistant

## 2012-07-29 VITALS — BP 168/82 | HR 83

## 2012-07-29 DIAGNOSIS — I10 Essential (primary) hypertension: Secondary | ICD-10-CM

## 2012-07-29 MED ORDER — LISINOPRIL 40 MG PO TABS: 40.0000 mg | ORAL_TABLET | Freq: Every day | ORAL | Status: DC

## 2012-07-29 NOTE — Progress Notes (Signed)
Patient ID: Troy Barnes, male   DOB: 05/30/1956, 56 y.o.   MRN: [id number]  Pt denies chest pain, SOB, dizziness, or heart palpitations.  Not taking benicar.  Denies medication side effects.  I did give Pt samples of novolog and lantus. 5 min spent with pt.                 Pt has not been taking benicar as he was supposed to. He was not approved for financial assistance for Benicar. I sent over Lisinopril since this should be relatively cheap. He tolerated this well last time that he tried. Lisinopril 40mg  sent to pharmacy. Recheck BP in 2 weeks. Tandy Gaw PA-C

## 2012-08-02 LAB — CBC AND DIFFERENTIAL: Hemoglobin: 10 g/dL — AB (ref 13.5–17.5)

## 2012-08-02 LAB — BASIC METABOLIC PANEL: Sodium: 143 mmol/L (ref 137–147)

## 2012-08-03 LAB — PROTIME-INR: Protime: 11.3 seconds (ref 10.0–13.8)

## 2012-08-03 LAB — POCT INR

## 2012-08-07 ENCOUNTER — Encounter: Payer: Self-pay | Admitting: Physician Assistant

## 2012-08-07 DIAGNOSIS — E113599 Type 2 diabetes mellitus with proliferative diabetic retinopathy without macular edema, unspecified eye: Secondary | ICD-10-CM | POA: Insufficient documentation

## 2012-08-07 DIAGNOSIS — H2510 Age-related nuclear cataract, unspecified eye: Secondary | ICD-10-CM | POA: Insufficient documentation

## 2012-08-07 DIAGNOSIS — E11311 Type 2 diabetes mellitus with unspecified diabetic retinopathy with macular edema: Secondary | ICD-10-CM | POA: Insufficient documentation

## 2012-08-14 ENCOUNTER — Encounter: Payer: Self-pay | Admitting: Physician Assistant

## 2012-08-14 ENCOUNTER — Ambulatory Visit (INDEPENDENT_AMBULATORY_CARE_PROVIDER_SITE_OTHER): Payer: Self-pay | Admitting: Physician Assistant

## 2012-08-14 VITALS — BP 140/72 | HR 74 | Ht 72.0 in | Wt 224.0 lb

## 2012-08-14 DIAGNOSIS — N189 Chronic kidney disease, unspecified: Secondary | ICD-10-CM

## 2012-08-14 DIAGNOSIS — E1140 Type 2 diabetes mellitus with diabetic neuropathy, unspecified: Secondary | ICD-10-CM

## 2012-08-14 DIAGNOSIS — E1142 Type 2 diabetes mellitus with diabetic polyneuropathy: Secondary | ICD-10-CM

## 2012-08-14 DIAGNOSIS — R609 Edema, unspecified: Secondary | ICD-10-CM

## 2012-08-14 DIAGNOSIS — E1149 Type 2 diabetes mellitus with other diabetic neurological complication: Secondary | ICD-10-CM

## 2012-08-14 DIAGNOSIS — Z79899 Other long term (current) drug therapy: Secondary | ICD-10-CM

## 2012-08-14 DIAGNOSIS — R600 Localized edema: Secondary | ICD-10-CM

## 2012-08-14 DIAGNOSIS — E11311 Type 2 diabetes mellitus with unspecified diabetic retinopathy with macular edema: Secondary | ICD-10-CM

## 2012-08-14 DIAGNOSIS — I10 Essential (primary) hypertension: Secondary | ICD-10-CM

## 2012-08-14 DIAGNOSIS — I509 Heart failure, unspecified: Secondary | ICD-10-CM

## 2012-08-14 MED ORDER — INSULIN GLARGINE 100 UNIT/ML ~~LOC~~ SOLN: 20.0000 [IU] | Freq: Every day | SUBCUTANEOUS | Status: DC

## 2012-08-14 NOTE — Patient Instructions (Signed)
Get una boot taken off at urgent care on Saturday. Get blood work to recheck kidneys. May wrap once more and then follow up with me 3 days later. Stay on all medications as directed.

## 2012-08-17 ENCOUNTER — Encounter: Payer: Self-pay | Admitting: Emergency Medicine

## 2012-08-17 ENCOUNTER — Emergency Department
Admission: EM | Admit: 2012-08-17 | Discharge: 2012-08-17 | Disposition: A | Discharge status: Home / Self Care | Payer: Self-pay | Source: Home / Self Care

## 2012-08-17 DIAGNOSIS — R600 Localized edema: Secondary | ICD-10-CM

## 2012-08-17 DIAGNOSIS — R609 Edema, unspecified: Secondary | ICD-10-CM

## 2012-08-17 NOTE — ED Provider Notes (Signed)
History     CSN: [id number]  Arrival date & time 08/17/12  1118   None     No chief complaint on file.   (Consider location/radiation/quality/duration/timing/severity/associated sxs/prior treatment) HPI See below for details of nursing visit:  Past Medical History  Diagnosis Date  . Diabetes mellitus     Type I since his 30s    Past Surgical History  Procedure Date  . Surgery scrotal / testicular 1975    for torsion    Family History  Problem Relation Age of Onset  . Diabetes Mother   . Heart disease Father     History  Substance Use Topics  . Smoking status: Never Smoker   . Smokeless tobacco: Not on file  . Alcohol Use: No      Review of Systems  Allergies  Review of patient's allergies indicates no known allergies.  Home Medications   Current Outpatient Rx  Name  Route  Sig  Dispense  Refill  . ASPIRIN 81 MG PO TABS   Oral   Take 81 mg by mouth daily.         Marland Kitchen CARVEDILOL 25 MG PO TABS   Oral   Take 25 mg by mouth 2 (two) times daily with a meal.         . FUROSEMIDE 20 MG PO TABS   Oral   Take 1 tablet (20 mg total) by mouth daily.   30 tablet   1   . HYDRALAZINE HCL 25 MG PO TABS   Oral   Take 25 mg by mouth 3 (three) times daily.         . INSULIN ASPART 100 UNIT/ML [place] SOLN   Subcutaneous   Inject 4 Units into the skin 3 (three) times daily before meals.   1 pen   12   . INSULIN GLARGINE 100 UNIT/ML [place] SOLN   Subcutaneous   Inject 20 Units into the skin daily.   21 mL   1     There were no vitals taken for this visit.  Physical Exam  ED Course  Procedures (including critical care time)  Labs Reviewed - No data to display No results found.   1. Lower extremity edema       MDM   This is a nursing visit.  The patient presents by his PCPs recommendations to have his Unna boot removed today.  It was done so by the nurse.  He has no signs of infection and no drainage.  He does have pitting edema however per  patient it has improved and he has lost a few pounds, likely due to the Lasix that he is on.  We did not replace the boot because I do not believe that they're necessary today but given him wound precautions and to make sure that he checks his feet a few times a day and also elevates his feet as well.  H will followup with his PCP in 3 days.  He does not have insurance nor can afford a co-pay today so we did this is a nursing visit free of charge.   Marlaine Hind, MD 08/17/12 1154

## 2012-08-17 NOTE — Progress Notes (Signed)
  Subjective:    Patient ID: Troy Barnes, male    DOB: [birth year], 56 y.o.   MRN: [id number]  HPI Patient presents to the clinic to follow up from hospital for lower extremity edema bilaterally. At the hospital he was diagnosed with CHF. His blood pressure medication was changed to Coreg and added hydralaxine. He continues on lasix but instead of PRN he is now taking daily. His sugars were very well controlled in hospital and on same regimen.  Labs at ER: CBC anemia, UA blood, protein, and glucose found, Ca low, Cr and BUN elevated, CK elevated, BNP extremely high, Doppler of extremities negative for clots, CXR normal.   DX was CHF with lower extremity edema. He was treated with IV lasix. He feels some better today. His blood pressure is much more controlled today. He denies any CP, palpitations, SOB. His legs continue to swell. He tries to elevate them as much as possible. He cannot get compression stockings on. He takes lasix 20mg  daily.   Pt is going to have laser  Treatment from eye doctor to help treat diabetic changes to eys.    Review of Systems     Objective:   Physical Exam  Constitutional: He is oriented to person, place, and time. He appears well-developed and well-nourished.  HENT:  Head: Normocephalic and atraumatic.  Neck: No JVD present.  Cardiovascular: Normal rate, regular rhythm and normal heart sounds.   Pulmonary/Chest: Effort normal and breath sounds normal.  Neurological: He is alert and oriented to person, place, and time.  Skin: Skin is dry.       2+ BILATERAL leg edema up to knees. Scaly skin with some erythema. NO open sores           Assessment & Plan:  CHF/Lower extremity edema/Chronic kidney diease/diabetes type II-I had checked a BNP before and normal. LIkely chronic renal disease cause CHF and has created lower extremity edema resistent to treatment. Will check BMP today and recheck kidney function and see if we can up his lasix. Placed pt in bilateral  una boot. Come back in 3 days to UC to have boot taken off. If not improved may apply another boot. Continue to keep elevated. Recheck next Tuesday. Continue diabetes regimen. BP looks much better today. Treatment next week for diabetic changes to eyes. Es.

## 2012-08-17 NOTE — ED Notes (Signed)
Patient of [facility] Sharper; told to come today to have unna boot wraps removed from both legs which were applied on 08-14-12.

## 2012-08-20 ENCOUNTER — Encounter: Payer: Self-pay | Admitting: *Deleted

## 2012-08-23 ENCOUNTER — Encounter: Payer: Self-pay | Admitting: Physician Assistant

## 2012-08-23 ENCOUNTER — Telehealth: Payer: Self-pay | Admitting: Physician Assistant

## 2012-08-23 ENCOUNTER — Ambulatory Visit (INDEPENDENT_AMBULATORY_CARE_PROVIDER_SITE_OTHER): Payer: Self-pay | Admitting: Physician Assistant

## 2012-08-23 VITALS — BP 194/86 | HR 68 | Ht 72.0 in | Wt 229.0 lb

## 2012-08-23 DIAGNOSIS — R609 Edema, unspecified: Secondary | ICD-10-CM

## 2012-08-23 DIAGNOSIS — R600 Localized edema: Secondary | ICD-10-CM

## 2012-08-23 DIAGNOSIS — I509 Heart failure, unspecified: Secondary | ICD-10-CM

## 2012-08-23 DIAGNOSIS — I10 Essential (primary) hypertension: Secondary | ICD-10-CM

## 2012-08-23 DIAGNOSIS — F329 Major depressive disorder, single episode, unspecified: Secondary | ICD-10-CM

## 2012-08-23 MED ORDER — HYDRALAZINE HCL 50 MG PO TABS: 50.0000 mg | ORAL_TABLET | Freq: Three times a day (TID) | ORAL | Status: DC

## 2012-08-23 MED ORDER — LISINOPRIL 40 MG PO TABS: 40.0000 mg | ORAL_TABLET | Freq: Every day | ORAL | Status: DC

## 2012-08-23 NOTE — Progress Notes (Signed)
Subjective:    Patient ID: Troy Barnes, male    DOB: 1955-12-05, 56 y.o.   MRN: [id number]  HPI Patient is a 56 year old male who presents to the clinic to followup on congestive heart failure and as a result lower leg edema, and blood pressure. He is really down today. His wife is not willing to take care of him and he feels like he cannot take care of himself. He is trying to get disability for 2 weeks Medicaid but nothing has worked out as of yet. His wife is threatening to leave and go back to [place]. He does he cannot make it without some type of help. His blood pressure is sky high  today. He reports he is taking his medications as directed. Both legs are still swollen but they are better than usual. He has came 7 pounds over the past week. Currently he is not restricting his fluids and she tries not to add salt but he may the things with salt on them. He has tried to wear compression stockings as much as he can. He reports that when her boots did not help with swelling at all.   Although down denies any suicidal thoughts.    Review of Systems     Objective:   Physical Exam  Constitutional: He is oriented to person, place, and time. He appears well-developed and well-nourished.  HENT:  Head: Normocephalic and atraumatic.  Cardiovascular: Normal rate, regular rhythm and normal heart sounds.   Pulmonary/Chest: Effort normal and breath sounds normal. He has no wheezes.       NO wheezing or rhonchi heard.  Neurological: He is alert and oriented to person, place, and time.  Skin: Skin is dry.       Bilateral 1+ pitting edema of legs up to knee. Legs are very scaly with minimal erythema. No open sores or area of discharge.  Psychiatric:       Flat affect and very tearful.          Assessment & Plan:  CHF/Lower leg edema/HTN-patient was taken off ACE inhibitor at the hospital. I would like to put patient back on ACE inhibitor to present to help with congestive heart failure.  Added lisinopril 40 mg once a day. I also increase his hydralazine to help with blood pressure 250 mg 3 times a day.Gave handout and talked about fluid restriction and salt restriction this is going to be important in regulating swelling and CHF. No more than 52 oz a day. I am going to refer him to cardiologist. I am trying to get a copy of echo the patient said was done in the hospital so the cardiologist will be able to do his ejection fraction. In the meantime patient will continue with Lasix 20 mg. Patient never got CMP to recheck kidney function so that we can increase if kidney function was better. Patient was encouraged to get BMP today but he said he probably wouldn't do it because of money. I do not know of any other resources to help him at home with activities of daily living. I am going to talk to Dr. Glade Lloyd and ask around and see if there is anyone he would accept someone without insurance. My nurse should followup with with [facility] and see if we can get echo results.follow up in 2 weeks for recheck BP or as needed.   Depression- discussed with patient I could give rx for depression. Pt declines he does not want anymore medications. Encouraged the  patient to stay up beat that things will get better.

## 2012-08-23 NOTE — Telephone Encounter (Signed)
LMOM for med records to fax or CB

## 2012-08-23 NOTE — Patient Instructions (Addendum)
Limit fluid to 52 oz a day. Increased hydralazine to 50mg  three times a day and then lisinopril once a day.  Sodium and Fluid Restriction Some health conditions may require you to restrict your sodium and fluid intake. Sodium is part of the salt in the blood. Sodium may be restricted because when you take in a lot of salt, you become thirsty. Limiting salt with help you become less thirsty and may make it easier to restrict fluid. Talk to your caregiver or dietician about how many cups of fluid and how many milligrams of sodium you are allowed each day. If your caregiver has restricted your sodium and fluids, usually the amount you can drink depends on several things, such as:  Your urine output.  How much fluid you are retaining.  Your blood pressure. Every 2 cups (500 mL) of fluid retained in the body becomes an extra 1 pound (0.5 kg) of body weight. The following are examples of some fluids you will have to restrict:  Tea, coffee, soda, lemonade, milk, water, and juice.  Alcoholic beverages.  Cream.  Gravy.  Ice cubes.  Soup and broth. The following are foods that become liquid at room temperature. These foods will count towards your fluid intake.  Ice cream and ice milk.  Frozen yogurt and sherbet.  Frozen ice pops.  Flavored gelatin. YOU MAY BE TAKING IN TOO MUCH FLUID IF:  Your weight increases.  Your face, hands, legs, feet, and abdomen start to swell.  You have trouble breathing. HOME CARE INSTRUCTIONS If you follow a low sodium diet closely, you will eat approximately 1,500 mg of sodium a day.   Avoid salty foods. This increases your thirst and makes fluid control more difficult. Foods high in sodium include:  Most canned foods, including most meats.  Most processed foods, including most meats.  Cheese.  Dried pasta and rice mixes.  Snack foods (chips, popcorn, pretzels, cheese puffs, salted nuts).  Dips, sauces, and salad dressings.  Do not use salt  in cooking or add salt to your meal. Adriana Simas with herbs and spices, but not those that have salt in the name. Ask your caregiver if it is okay to use salt substitutes.  Eat home-prepared meals. Use fresh ingredients. Avoid canned, frozen, or packaged meals.  Read food labels to see how much sodium is in the food. Know how much sodium you are allowed each day.  When eating out, ask for dressings and sauces on the side.  Weigh yourself every morning with an empty bladder before you eat or drink. If your weight is going up, you are retaining fluid.  Freeze fruit juice or water in an ice cube tray. Use this as part of your fluid allowance.  Brush your teeth often or rinse your mouth with mouthwash to help your dry mouth. Lemon wedges, hard sour candies, chewing gum, or breath spray may help to moisten your mouth too.  Add a slice of fresh lemon or lemon juice to water or ice. This helps satisfy your thirst.  Try frozen fruits between meals, such as grapes or strawberries.  Swallow your pills along with meals or soft foods. This helps you save your fluid for something you enjoy.  Use small cups and glasses and learn to sip fluids slowly.  Keep your home cooler. Keep the air in your home as humid as possible. Dry air increases thirst.  Avoid being out in the hot sun. Each morning, fill a jug with the amount of water  you are allowed for the day. You can use this water as a guideline for fluid allowance. Each time you take in fluid, pour an equal amount of water out of the container. This helps you to see how much fluid you are taking in. It also helps plan your fluid intake for the rest of the day. CONVERSIONS TO HELP MEASURE FLUID INTAKE  1 cup equals 8 oz (240 mL).   cup equals 6 oz (180 mL).   cup equals 5  oz (160 mL).   cup equals 4 oz (120 mL).   cup equals 2  oz (80 mL).   cup equals 2 oz (60 mL).  2 tbs equals 1 oz (30 mL). Document Released: 07/23/2007 Document Revised:  03/26/2012 Document Reviewed: 03/08/2012 [facility] Patient Information 2013 [place], [place].

## 2012-08-23 NOTE — Telephone Encounter (Signed)
Can you call [facility] ED and see if echo with ejection fraction was done on patient? I need this echo in order to refer to cardiology. Can we get a copy?

## 2012-09-16 ENCOUNTER — Telehealth: Payer: Self-pay | Admitting: *Deleted

## 2012-09-16 MED ORDER — FUROSEMIDE 40 MG PO TABS: 40.0000 mg | ORAL_TABLET | Freq: Every day | ORAL | Status: DC

## 2012-09-16 MED ORDER — HYDRALAZINE HCL 50 MG PO TABS: 50.0000 mg | ORAL_TABLET | Freq: Three times a day (TID) | ORAL | Status: DC

## 2012-09-16 MED ORDER — CARVEDILOL 25 MG PO TABS: 25.0000 mg | ORAL_TABLET | Freq: Two times a day (BID) | ORAL | Status: DC

## 2012-09-16 NOTE — Telephone Encounter (Signed)
Pt called requesting refills. Said he needed Lasix 40mg  refilled. I see him on Lasix 20mg . He said they increased it while in the hospital. Please advise. Needs filled regardless of the dose

## 2012-09-16 NOTE — Telephone Encounter (Signed)
Sent to the pharmacy.I went ahead and increased dose.

## 2012-10-18 ENCOUNTER — Ambulatory Visit (INDEPENDENT_AMBULATORY_CARE_PROVIDER_SITE_OTHER): Payer: Self-pay | Admitting: Physician Assistant

## 2012-10-18 ENCOUNTER — Encounter: Payer: Self-pay | Admitting: Physician Assistant

## 2012-10-18 VITALS — BP 148/64 | HR 97 | Ht 72.0 in | Wt 233.0 lb

## 2012-10-18 DIAGNOSIS — E1140 Type 2 diabetes mellitus with diabetic neuropathy, unspecified: Secondary | ICD-10-CM

## 2012-10-18 DIAGNOSIS — R600 Localized edema: Secondary | ICD-10-CM

## 2012-10-18 DIAGNOSIS — I509 Heart failure, unspecified: Secondary | ICD-10-CM

## 2012-10-18 DIAGNOSIS — N184 Chronic kidney disease, stage 4 (severe): Secondary | ICD-10-CM

## 2012-10-18 DIAGNOSIS — N049 Nephrotic syndrome with unspecified morphologic changes: Secondary | ICD-10-CM

## 2012-10-18 DIAGNOSIS — IMO0002 Reserved for concepts with insufficient information to code with codable children: Secondary | ICD-10-CM

## 2012-10-18 DIAGNOSIS — E1065 Type 1 diabetes mellitus with hyperglycemia: Secondary | ICD-10-CM

## 2012-10-18 MED ORDER — INSULIN GLARGINE 100 UNIT/ML ~~LOC~~ SOLN: SUBCUTANEOUS | Status: DC

## 2012-10-18 NOTE — Patient Instructions (Addendum)
Sodium and Fluid Restriction Some health conditions may require you to restrict your sodium and fluid intake. Sodium is part of the salt in the blood. Sodium may be restricted because when you take in a lot of salt, you become thirsty. Limiting salt with help you become less thirsty and may make it easier to restrict fluid. Talk to your caregiver or dietician about how many cups of fluid and how many milligrams of sodium you are allowed each day. If your caregiver has restricted your sodium and fluids, usually the amount you can drink depends on several things, such as:  Your urine output.  How much fluid you are retaining.  Your blood pressure. Every 2 cups (500 mL) of fluid retained in the body becomes an extra 1 pound (0.5 kg) of body weight. The following are examples of some fluids you will have to restrict:  Tea, coffee, soda, lemonade, milk, water, and juice.  Alcoholic beverages.  Cream.  Gravy.  Ice cubes.  Soup and broth. The following are foods that become liquid at room temperature. These foods will count towards your fluid intake.  Ice cream and ice milk.  Frozen yogurt and sherbet.  Frozen ice pops.  Flavored gelatin. YOU MAY BE TAKING IN TOO MUCH FLUID IF:  Your weight increases.  Your face, hands, legs, feet, and abdomen start to swell.  You have trouble breathing. HOME CARE INSTRUCTIONS If you follow a low sodium diet closely, you will eat approximately 1,500 mg of sodium a day.   Avoid salty foods. This increases your thirst and makes fluid control more difficult. Foods high in sodium include:  Most canned foods, including most meats.  Most processed foods, including most meats.  Cheese.  Dried pasta and rice mixes.  Snack foods (chips, popcorn, pretzels, cheese puffs, salted nuts).  Dips, sauces, and salad dressings.  Do not use salt in cooking or add salt to your meal. Troy Barnes with herbs and spices, but not those that have salt in the name. Ask  your caregiver if it is okay to use salt substitutes.  Eat home-prepared meals. Use fresh ingredients. Avoid canned, frozen, or packaged meals.  Read food labels to see how much sodium is in the food. Know how much sodium you are allowed each day.  When eating out, ask for dressings and sauces on the side.  Weigh yourself every morning with an empty bladder before you eat or drink. If your weight is going up, you are retaining fluid.  Freeze fruit juice or water in an ice cube tray. Use this as part of your fluid allowance.  Brush your teeth often or rinse your mouth with mouthwash to help your dry mouth. Lemon wedges, hard sour candies, chewing gum, or breath spray may help to moisten your mouth too.  Add a slice of fresh lemon or lemon juice to water or ice. This helps satisfy your thirst.  Try frozen fruits between meals, such as grapes or strawberries.  Swallow your pills along with meals or soft foods. This helps you save your fluid for something you enjoy.  Use small cups and glasses and learn to sip fluids slowly.  Keep your home cooler. Keep the air in your home as humid as possible. Dry air increases thirst.  Avoid being out in the hot sun. Each morning, fill a jug with the amount of water you are allowed for the day. You can use this water as a guideline for fluid allowance. Each time you take in  fluid, pour an equal amount of water out of the container. This helps you to see how much fluid you are taking in. It also helps plan your fluid intake for the rest of the day. CONVERSIONS TO HELP MEASURE FLUID INTAKE  1 cup equals 8 oz (240 mL).   cup equals 6 oz (180 mL).   cup equals 5  oz (160 mL).   cup equals 4 oz (120 mL).   cup equals 2  oz (80 mL).   cup equals 2 oz (60 mL).  2 tbs equals 1 oz (30 mL). Document Released: 07/23/2007 Document Revised: 03/26/2012 Document Reviewed: 03/08/2012 [facility] Patient Information 2013 [place], [place].

## 2012-10-18 NOTE — Progress Notes (Addendum)
Subjective:    Patient ID: Troy Barnes, male    DOB: 02/24/1956, 57 y.o.   MRN: [id number]  HPI Patient is a 57 year old male who presents to the clinic after being discharged from hospital. Patient was admitted to the hospital on 09/27/2012 with worsening bilateral legs edema and CHF exacerbation.  Patient was diuresed with IV Bumex amylase in excess of 20 pounds of weight in his legs. Lab work and hospital consisted of CBC with white count being stable and hemoglobin at 8.1 on discharge. His feet and he was elevated at 9554. Anemia workup was done and hospital and his iron levels were normal as well as ferritin being high normal and B12 and folate normal. Hemoglobin A1c was 6.6. Electrolyte on CMP were abnormal on admission but upon discharge were normal despite high BUN at 56 and high creatinine at 2.4 which is expected due to renal failure. Chest x-ray revealed probable right pleural effusion and left base infiltrate. Renal ultrasound showed the right kidney measured 15.2 cm and left kidney measured 13.3 cm and normal echotexture.  Hydralazine was increased to 100 mg by mouth 3 times a day. Lantus was slightly increased to 30 units at bedtime. Lasix was discontinued and Bumex 2 mg twice a day was added. Norvasc 5 mg daily was added as well as Imdur 60 mg a day was added.  Patient was discharged on 10/07/2012.  Patient is feeling better today but his legs are still very tight and make it hard to walk. Patient pressure is doing. Patient's BP increased slightly today. Denies any chest pains, palpitations. Patient denies any trouble breathing today. He is trying to maintain a low-sodium and diabetic diet. He is trying to walk as much as he can but finds when he walks the swelling is worse. He felt like his sugars are very well controlled. Fasting sugars are still under 120.     Review of Systems     Objective:   Physical Exam  Constitutional: He is oriented to person, place, and time.   Patient walks with a walker today.  HENT:  Head: Normocephalic and atraumatic.  Cardiovascular: Normal rate, regular rhythm and normal heart sounds.   Pulmonary/Chest: Effort normal and breath sounds normal.       Rales were heard at the bottom of left lung. Otherwise lungs were unremarkable.  Neurological: He is alert and oriented to person, place, and time.  Skin:       Bilateral legs are still full with edema 3+. Skin is stretched and very scaly and red.  Psychiatric:       Patient is very down about his illness today. He feels like he has no hope of getting better.          Assessment & Plan:  Diabetes type 1 uncontrolled- A1c was 6.6 and hospital and Lantus was increased to 30 units at bedtime. Patient still continues to use sliding self her NovoLog. Patient still cannot afford diabetic medication did give samples had an closet. Patient encouraged to continue diabetic diet.  Volume overload with anasarca/CHF/Nephrotic/Stage 4 kidney disease- patient's medications were changed and hospital to help with edema in bilateral legs. Patient is taking Bumex 2 mg twice a day as directed. Patient has discontinued Lasix.  It appears patient has only lost 5lbs since leaving hospital.Patient reports he's not able to fit into compression stockings. Patient is maintaining a low sodium diet. Encouraged patient to stick to fluid restrictions of no more than 50 ounces of fluid a  day. Patient instructed to watch weight increase daily and call if greater than 5 pounds of increase in weight need to call office. Follow up in 2 weeks to recheck labs and weight check. Will refer to nephrologist. I did consider adding spirolactone but I am worried about his kidneys.    Spent 30 minutes with patient and greater than 50 percent of time spent counseling patient regarding importance of fluid restriction and weight control.

## 2012-10-20 DIAGNOSIS — R600 Localized edema: Secondary | ICD-10-CM | POA: Insufficient documentation

## 2012-10-20 DIAGNOSIS — I509 Heart failure, unspecified: Secondary | ICD-10-CM | POA: Insufficient documentation

## 2012-10-21 ENCOUNTER — Telehealth: Payer: Self-pay | Admitting: Physician Assistant

## 2012-10-21 DIAGNOSIS — N184 Chronic kidney disease, stage 4 (severe): Secondary | ICD-10-CM

## 2012-10-21 DIAGNOSIS — I509 Heart failure, unspecified: Secondary | ICD-10-CM

## 2012-10-21 DIAGNOSIS — N189 Chronic kidney disease, unspecified: Secondary | ICD-10-CM | POA: Insufficient documentation

## 2012-10-21 DIAGNOSIS — R600 Localized edema: Secondary | ICD-10-CM

## 2012-10-21 DIAGNOSIS — E1140 Type 2 diabetes mellitus with diabetic neuropathy, unspecified: Secondary | ICD-10-CM

## 2012-10-21 DIAGNOSIS — N049 Nephrotic syndrome with unspecified morphologic changes: Secondary | ICD-10-CM

## 2012-10-21 DIAGNOSIS — D631 Anemia in chronic kidney disease: Secondary | ICD-10-CM

## 2012-10-21 MED ORDER — ISOSORBIDE MONONITRATE ER 60 MG PO TB24: 60.0000 mg | ORAL_TABLET | Freq: Every day | ORAL | Status: DC

## 2012-10-21 MED ORDER — BUMETANIDE 2 MG PO TABS: 2.0000 mg | ORAL_TABLET | Freq: Two times a day (BID) | ORAL | Status: DC

## 2012-10-21 MED ORDER — AMLODIPINE BESYLATE 5 MG PO TABS: 5.0000 mg | ORAL_TABLET | Freq: Every day | ORAL | Status: DC

## 2012-10-21 MED ORDER — HYDRALAZINE HCL 100 MG PO TABS: 100.0000 mg | ORAL_TABLET | Freq: Three times a day (TID) | ORAL | Status: DC

## 2012-10-21 NOTE — Addendum Note (Signed)
Addended by: Jomarie Longs on: 10/21/2012 06:03 PM   Modules accepted: Orders, Level of Service

## 2012-10-21 NOTE — Telephone Encounter (Signed)
Please call patient and let him know to follow up in 2 weeks if not have appt with nephrologist by then to redo bloodwork and assess swelling.

## 2012-10-21 NOTE — Telephone Encounter (Signed)
Call pt: Let him know finally got records. Need to repeat CXR will send order. I have to try to get you in with nephrologist after looking at your numbers you need to be seen by specialist to maintain as much kidney function as possible.

## 2012-10-22 NOTE — Telephone Encounter (Signed)
Pt informed

## 2012-11-04 ENCOUNTER — Encounter: Payer: Self-pay | Admitting: Physician Assistant

## 2013-01-03 ENCOUNTER — Telehealth: Payer: Self-pay | Admitting: *Deleted

## 2013-01-03 NOTE — Telephone Encounter (Signed)
Wife comes in office today to bring patients updated medications since being discharged from [facility] nursing facility. He is not taking any of the meds you had him on they changed them. Updated list: Amlodipine Besylate 10mg  take 1 daily Isosorbide Mononitrate ER 60mg  take 1 daily Lisinopril 10mg  take 1 daily Carvedilol 3.125mg  take 1 tab twice a day Clonidine HCL 0.1mg  take 1 tab twice a day (hold for HR <60) Furosemide 40mg  take 1 tab twice a day Hydralazine HCL 100mg  tab take 1 tab 3 times a day\  Request that all these be refilled and sent to [facility] in [place] as they loose his Medicaid coverage on the 31st of March. I have updated his med list in the chart as well if you can just send the refills. Wife made a followup with you as well while in office today. Barry Dienes, LPN

## 2013-01-06 MED ORDER — HYDRALAZINE HCL 100 MG PO TABS: 100.0000 mg | ORAL_TABLET | Freq: Three times a day (TID) | ORAL | Status: DC

## 2013-01-06 MED ORDER — CARVEDILOL 3.125 MG PO TABS: 3.1250 mg | ORAL_TABLET | Freq: Two times a day (BID) | ORAL | Status: DC

## 2013-01-06 MED ORDER — AMLODIPINE BESYLATE 10 MG PO TABS: 10.0000 mg | ORAL_TABLET | Freq: Every day | ORAL | Status: DC

## 2013-01-06 MED ORDER — FUROSEMIDE 40 MG PO TABS: 40.0000 mg | ORAL_TABLET | Freq: Two times a day (BID) | ORAL | Status: DC

## 2013-01-06 MED ORDER — LISINOPRIL 10 MG PO TABS: 10.0000 mg | ORAL_TABLET | Freq: Every day | ORAL | Status: DC

## 2013-01-06 MED ORDER — ISOSORBIDE MONONITRATE ER 60 MG PO TB24: 60.0000 mg | ORAL_TABLET | Freq: Every day | ORAL | Status: DC

## 2013-01-06 MED ORDER — CLONIDINE HCL 0.1 MG PO TABS: 0.1000 mg | ORAL_TABLET | Freq: Two times a day (BID) | ORAL | Status: DC

## 2013-01-13 ENCOUNTER — Ambulatory Visit: Payer: Medicaid Other | Admitting: Physician Assistant

## 2013-01-17 ENCOUNTER — Ambulatory Visit (INDEPENDENT_AMBULATORY_CARE_PROVIDER_SITE_OTHER): Payer: Medicaid Other | Admitting: Physician Assistant

## 2013-01-17 ENCOUNTER — Encounter: Payer: Self-pay | Admitting: Physician Assistant

## 2013-01-17 VITALS — BP 83/43 | HR 84 | Wt 166.0 lb

## 2013-01-17 DIAGNOSIS — R110 Nausea: Secondary | ICD-10-CM

## 2013-01-17 DIAGNOSIS — I959 Hypotension, unspecified: Secondary | ICD-10-CM

## 2013-01-17 DIAGNOSIS — N184 Chronic kidney disease, stage 4 (severe): Secondary | ICD-10-CM

## 2013-01-17 LAB — COMPLETE METABOLIC PANEL WITH GFR
CO2: 21 mEq/L (ref 19–32)
Creat: 2.77 mg/dL — ABNORMAL HIGH (ref 0.50–1.35)
GFR, Est African American: 28 mL/min — ABNORMAL LOW
GFR, Est Non African American: 25 mL/min — ABNORMAL LOW
Glucose, Bld: 365 mg/dL — ABNORMAL HIGH (ref 70–99)
Total Bilirubin: 3.5 mg/dL — ABNORMAL HIGH (ref 0.3–1.2)

## 2013-01-17 MED ORDER — ONDANSETRON HCL 4 MG PO TABS: 4.0000 mg | ORAL_TABLET | Freq: Three times a day (TID) | ORAL | Status: DC | PRN

## 2013-01-17 NOTE — Patient Instructions (Addendum)
Decrease Clonidine .1mg  tab once a day. If not BP changes then stop clonidine.

## 2013-01-18 NOTE — Progress Notes (Signed)
  Subjective:    Patient ID: Troy Barnes, male    DOB: Mar 24, 1956, 57 y.o.   MRN: [id number]  HPI Patient is a 57 yo male in chronic kidney disease stage 4, DM type II uncontrolled that comes in with dizziness and fatigue. He was recently discharged from the hospital after a course of edema. His legs feel much better. He was sent ot nursing facility to rehab but was never seen. He was discharged on 3/15. They restricted his fluids in hospital and salt. His BP has been running extremely low and he is dry heaving a lot throughout the day. He feels very weak and tired. Taking all meds as prescribed. Not checking sugars.    Review of Systems     Objective:   Physical Exam  Constitutional: He is oriented to person, place, and time.  Weak. Walking with a walker.  HENT:  Head: Normocephalic and atraumatic.  Eyes:  Black circles under eyes.  Cardiovascular: Normal rate, regular rhythm and normal heart sounds.   Pulmonary/Chest: Effort normal and breath sounds normal. He has no wheezes.  Dry heaving in office.  Abdominal: Soft. Bowel sounds are normal. There is no tenderness.  Neurological: He is alert and oriented to person, place, and time. Coordination abnormal.  Skin: Skin is warm and dry.  Leg edema is much improved. No edema in legs today.   Psychiatric: He has a normal mood and affect. His behavior is normal.          Assessment & Plan:  Hypotension- discussed with patient to increase fluids to a day and to decrease clonadine to once a day. If BP's still running low in 5 days stop clonadine. Will check kidney and liver today.   DM- declined A1C check today and wants to wait until he has medicaid again. They took it away due to drawing too much but should get it back soon. Not checking throughout the day. Continuing with insulin treatment of lantus. Will check random with CmP. Concerned he might be in DKA.   Chronic kidney disease stage 4- Needs follow up with kidney  specialist. Not going because of medicaid right now. Reminded of free clinics to get what he needs.

## 2013-02-04 ENCOUNTER — Telehealth: Payer: Self-pay | Admitting: Physician Assistant

## 2013-02-04 ENCOUNTER — Ambulatory Visit: Payer: Self-pay | Admitting: Family Medicine

## 2013-02-04 NOTE — Telephone Encounter (Signed)
Patient's wife called advised that pt was seen 3 wks ago and had blood work and that [employer] recommended patient to go to the hospital but he is very weak and is refusing to go to the hospital. Patient request to speak to [facility] personally and had an appointment today w/ Dr. Linford Arnold but canceled appointment due to being so weak. Spoke w/ Selena Batten and [facility] about patient and due to Lesly Rubenstein being off today (Tuesday 02/04/13) they recommend patient's wife to call EMS if patient is too weak to go to hospital. [phone]. Thanks

## 2013-02-12 ENCOUNTER — Ambulatory Visit (INDEPENDENT_AMBULATORY_CARE_PROVIDER_SITE_OTHER): Payer: Medicaid Other | Admitting: Physician Assistant

## 2013-02-12 ENCOUNTER — Encounter: Payer: Self-pay | Admitting: Physician Assistant

## 2013-02-12 VITALS — BP 179/90 | HR 84 | Wt 183.0 lb

## 2013-02-12 DIAGNOSIS — N184 Chronic kidney disease, stage 4 (severe): Secondary | ICD-10-CM

## 2013-02-12 DIAGNOSIS — E1065 Type 1 diabetes mellitus with hyperglycemia: Secondary | ICD-10-CM

## 2013-02-12 DIAGNOSIS — N049 Nephrotic syndrome with unspecified morphologic changes: Secondary | ICD-10-CM

## 2013-02-12 DIAGNOSIS — R748 Abnormal levels of other serum enzymes: Secondary | ICD-10-CM

## 2013-02-12 DIAGNOSIS — I10 Essential (primary) hypertension: Secondary | ICD-10-CM

## 2013-02-12 DIAGNOSIS — N039 Chronic nephritic syndrome with unspecified morphologic changes: Secondary | ICD-10-CM

## 2013-02-12 DIAGNOSIS — D631 Anemia in chronic kidney disease: Secondary | ICD-10-CM

## 2013-02-12 LAB — COMPLETE METABOLIC PANEL WITH GFR
ALT: 52 U/L (ref 0–53)
Albumin: 2.4 g/dL — ABNORMAL LOW (ref 3.5–5.2)
CO2: 21 mEq/L (ref 19–32)
Calcium: 7.6 mg/dL — ABNORMAL LOW (ref 8.4–10.5)
Chloride: 106 mEq/L (ref 96–112)
GFR, Est African American: 35 mL/min — ABNORMAL LOW
GFR, Est Non African American: 30 mL/min — ABNORMAL LOW
Glucose, Bld: 280 mg/dL — ABNORMAL HIGH (ref 70–99)
Potassium: 3.6 mEq/L (ref 3.5–5.3)
Sodium: 138 mEq/L (ref 135–145)
Total Protein: 4.8 g/dL — ABNORMAL LOW (ref 6.0–8.3)

## 2013-02-12 LAB — CBC
Hemoglobin: 9.8 g/dL — ABNORMAL LOW (ref 13.0–17.0)
MCH: 30.8 pg (ref 26.0–34.0)
RBC: 3.18 MIL/uL — ABNORMAL LOW (ref 4.22–5.81)
WBC: 10.6 10*3/uL — ABNORMAL HIGH (ref 4.0–10.5)

## 2013-02-12 LAB — HEPATIC FUNCTION PANEL
ALT: 52 U/L (ref 0–53)
Albumin: 2.4 g/dL — ABNORMAL LOW (ref 3.5–5.2)
Total Protein: 4.8 g/dL — ABNORMAL LOW (ref 6.0–8.3)

## 2013-02-12 NOTE — Telephone Encounter (Signed)
Pt came into office.

## 2013-02-12 NOTE — Patient Instructions (Addendum)
Goal fasting- Around 100. If not at goal increase by 1 unit every 3 days until get to goal.   Continue checking sugar before meals adjust according to sliding scale.   Will work on lantus approval for diabetes due to not afford.

## 2013-02-12 NOTE — Progress Notes (Signed)
  Subjective:    Patient ID: Troy Barnes, male    DOB: 30-Jun-1956, 57 y.o.   MRN: [id number]  HPI Patient presents to the clinic to follow up on elevated liver enzymes, chronic kidney disease, and HTN.  Will recheck elevated liver enyzmes and renal function today. Likely pt was dehydrated. Decrease diuretics at last visit. Pt is feeling much better today and has more energy. Denies feelings of dizziness.   HTN- uncontrolled today but has not taken any of his BP meds. Denies palpitations, CP, HA's. Per pt feels better when it is high than low.   DM- Uncontrolled. Reports fasting 90-140 in am. Meal time depends and does use sliding scale with novolog. Lantus is 30 units nightly but having problems paying for it. Denies any hypoglycemic episodes. 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     Review of Systems     Objective:   Physical Exam  Constitutional: He is oriented to person, place, and time. He appears well-developed and well-nourished.  HENT:  Head: Normocephalic and atraumatic.  Cardiovascular: Normal rate, regular rhythm and normal heart sounds.   Pulmonary/Chest: Effort normal and breath sounds normal.  Neurological: He is alert and oriented to person, place, and time.  Skin: Skin is warm and dry.  Swelling in legs are very well controlled.   Psychiatric: He has a normal mood and affect. His behavior is normal.          Assessment & Plan:  Diabetes- A1C 8.4. Discussed with patient this is not controlled and not really making sense with numbers he is reporting. I would like for him to keep a better log for 3 weeks of him checking his sugar fasting in am and before meals. Also bring in sliding scale that he is using. Continue on lantus. Follow up in 3 weeks.   HTN- not accurate BP since not on medicaitons. Keep log of home BP's. RTC in 3 weeks on medication and lets see what you are running. Continue to keep fluid and salt restrictions.   Chronic  kidney disease/elevated liver enzymes- will recheck today. LIkely complications of current problems will continue to follow.

## 2013-02-13 LAB — HEPATITIS PANEL, ACUTE
Hep A IgM: NEGATIVE
Hep B C IgM: NEGATIVE

## 2013-03-05 ENCOUNTER — Encounter: Payer: Self-pay | Admitting: *Deleted

## 2013-03-05 ENCOUNTER — Telehealth: Payer: Self-pay | Admitting: *Deleted

## 2013-03-05 NOTE — Telephone Encounter (Signed)
Spoke with patient's son & notified him of the letter being up at the front desk.

## 2013-03-05 NOTE — Telephone Encounter (Signed)
Ok for letter that legally blind and is not authorized to drive a vehicle.

## 2013-03-05 NOTE — Telephone Encounter (Signed)
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  They are trying to get him taken off the car ins & ins is needing this letter.

## 2013-03-14 ENCOUNTER — Telehealth: Payer: Self-pay | Admitting: Physician Assistant

## 2013-03-14 NOTE — Telephone Encounter (Signed)
Spoke with patient this morning. He is swelling everywhere and finding it difficult to breath. Has congestive heart failure and stage 4 kidney disease. He was not able to afford last batch of medications and just been taking some and not some for last month or so. He wanted to make me aware he thinks he needs to go to hospital. I agree. It would also give him medicaid for 6 months and that would allow him to get meds. Pt is going to [facility].

## 2013-03-31 ENCOUNTER — Other Ambulatory Visit: Payer: Self-pay | Admitting: Physician Assistant

## 2013-03-31 ENCOUNTER — Telehealth: Payer: Self-pay | Admitting: *Deleted

## 2013-03-31 MED ORDER — POTASSIUM CHLORIDE CRYS ER 20 MEQ PO TBCR: EXTENDED_RELEASE_TABLET | ORAL | Status: DC

## 2013-03-31 MED ORDER — BUMETANIDE 2 MG PO TABS: 2.0000 mg | ORAL_TABLET | Freq: Every day | ORAL | Status: DC

## 2013-03-31 MED ORDER — CARVEDILOL 25 MG PO TABS: 25.0000 mg | ORAL_TABLET | Freq: Two times a day (BID) | ORAL | Status: AC

## 2013-03-31 MED ORDER — ISOSORBIDE MONONITRATE ER 120 MG PO TB24: 120.0000 mg | ORAL_TABLET | Freq: Every day | ORAL | Status: DC

## 2013-03-31 NOTE — Progress Notes (Signed)
Does patient still have edema in extremities. I would consider increase diuretics since we took him off other BP meds due to kidney disease.

## 2013-03-31 NOTE — Telephone Encounter (Signed)
Kelly from [facility] left a message at the front desk giving you an update of her home visit with pt.  She stated that his bp was 182/98 and his temp was 99.6 but went down to 99.1.

## 2013-04-01 ENCOUNTER — Telehealth: Payer: Self-pay | Admitting: *Deleted

## 2013-04-01 NOTE — Progress Notes (Signed)
LMOM for [facility] from [facility] to call me back.  If the pt does still have edema, how much of an increase would you like on his diuretic?

## 2013-04-01 NOTE — Telephone Encounter (Signed)
Kelly from [facility] returned my call & LM stating that pt had 2+ edema on both ankles & generalized puffiness.

## 2013-04-02 ENCOUNTER — Ambulatory Visit: Payer: Self-pay | Admitting: Physician Assistant

## 2013-04-02 DIAGNOSIS — Z0289 Encounter for other administrative examinations: Secondary | ICD-10-CM

## 2013-04-02 NOTE — Progress Notes (Signed)
Sent info 2 tabs of 2mg  bumex twice a day.

## 2013-04-02 NOTE — Telephone Encounter (Signed)
Please call pt and kelly. He is supposed to be taking bumex 2mg  twice a day would you please increase to 2 tabs twice a day. Follow up daily with blood pressures and edema follow ups.

## 2013-04-03 NOTE — Telephone Encounter (Signed)
Left message on Kelly's vm to increase bumex to 2 tabs bid.  Tried to call pt but couldn't reach him.

## 2013-04-04 ENCOUNTER — Ambulatory Visit (INDEPENDENT_AMBULATORY_CARE_PROVIDER_SITE_OTHER): Payer: Medicaid Other | Admitting: Physician Assistant

## 2013-04-04 ENCOUNTER — Encounter: Payer: Self-pay | Admitting: Physician Assistant

## 2013-04-04 VITALS — BP 145/79 | HR 79 | Wt 180.0 lb

## 2013-04-04 DIAGNOSIS — R609 Edema, unspecified: Secondary | ICD-10-CM

## 2013-04-04 DIAGNOSIS — R600 Localized edema: Secondary | ICD-10-CM

## 2013-04-04 DIAGNOSIS — I5022 Chronic systolic (congestive) heart failure: Secondary | ICD-10-CM

## 2013-04-04 DIAGNOSIS — I509 Heart failure, unspecified: Secondary | ICD-10-CM

## 2013-04-04 DIAGNOSIS — I10 Essential (primary) hypertension: Secondary | ICD-10-CM

## 2013-04-04 DIAGNOSIS — E1065 Type 1 diabetes mellitus with hyperglycemia: Secondary | ICD-10-CM

## 2013-04-04 NOTE — Telephone Encounter (Signed)
Pt was seen in office today

## 2013-04-04 NOTE — Progress Notes (Signed)
  Subjective:    Patient ID: Troy Barnes, male    DOB: [birth year], 56 y.o.   MRN: [id number]  HPI Patient presents to the clinic to follow up after the hospital visit for CHF exacerbation. About 30lbs of fluid was diuresed off him during hospital stay and [facility]. He meds were changed to help him keep fluid off. He was taken off Lisinopril due to declining kidney function. He was offered dialysis but pt refused. After discharge he feels much better. No SOB. Swelling well controlled. Still continues to have no ability to care for himself.  He has home health coming 3 days a week but not enough he really wants to be in an skilled nursing center. His wife is concerned about that decision because she thinks they can't make it without his disability check. He reports he cannot afford lantus and currently out. He is also not checking his sugars due to not being able to see. He has started to give up. He states "what is the point". He is interested in filling out advanced directives.       Review of Systems     Objective:   Physical Exam  Constitutional: He is oriented to person, place, and time.  IN a wheelchair seems weak and tired.  HENT:  Head: Normocephalic and atraumatic.  Cardiovascular: Normal rate, regular rhythm and normal heart sounds.   Pulmonary/Chest: Effort normal and breath sounds normal. He has no wheezes. He has no rales.  Neurological: He is alert and oriented to person, place, and time.  Skin:  Dry, cracked bilateral legs. 1+ pitting edema bilaterally.  Psychiatric: He has a normal mood and affect. His behavior is normal.          Assessment & Plan:  CHF/DM/Lower extremity edema- Seems controlled today. He is pending medicaid approval. I agree that a skilled nursing home is best place for him. Gave sample of lantus. Expressed importance of checking sugars and taking insulin. He does have fast acting insulin told him to take 4 units at largest meal. BP  looks good today. Continue medications. Would love to get visit to nephrologist but we have to get patient on board. I am giving patient some controll over diruretic if stable stay at bumex 1 tab twice a day. If starting to swell can go up to 2 tabs twice a day. Stay off lisinopril for now. Will check kidney function in next 2 weeks.Talk with home health and skilled nursing about advanced directives. Write down exactly the things he wants and does not want as far as medical intervention. Follow up in 1 month if not in skilled nursing.   Spent 30 minutes with patient and greater than 50 percent of visit was spent counseling regarding skilled nursing, diabetes, edema control.

## 2013-04-04 NOTE — Patient Instructions (Addendum)
Start rapid acting 4 units at largest meal.  Lantus decrease to 25 units at night.   Bumex always take 1 tab twice a day but can take up to 2 tabs twice a day in response to lower leg edema.   Ask home health about Advanced Directives.

## 2013-04-06 ENCOUNTER — Telehealth: Payer: Self-pay | Admitting: Physician Assistant

## 2013-04-06 NOTE — Telephone Encounter (Signed)
Please call home health for patient and see if some time this week they could draw and BMP?

## 2013-04-07 ENCOUNTER — Encounter: Payer: Self-pay | Admitting: *Deleted

## 2013-04-07 DIAGNOSIS — L89109 Pressure ulcer of unspecified part of back, unspecified stage: Secondary | ICD-10-CM

## 2013-04-07 DIAGNOSIS — I509 Heart failure, unspecified: Secondary | ICD-10-CM

## 2013-04-07 DIAGNOSIS — I5023 Acute on chronic systolic (congestive) heart failure: Secondary | ICD-10-CM

## 2013-04-07 DIAGNOSIS — N390 Urinary tract infection, site not specified: Secondary | ICD-10-CM

## 2013-04-07 NOTE — Telephone Encounter (Signed)
Order faxed to [facility].  

## 2013-04-10 ENCOUNTER — Telehealth: Payer: Self-pay | Admitting: *Deleted

## 2013-04-10 NOTE — Telephone Encounter (Signed)
Called nurse back and gave instructions. She will inform pt to increase the Lantus to 34 units at bedtime. She also wanted you to know that he is taking Bumex 2mg  one twice a day as well. He does not have a scale so is not weighing himself so instructed nurse to tell him if swelling gets any worse he will need to go to ED over the weekend. Barry Dienes, LPN

## 2013-04-10 NOTE — Telephone Encounter (Signed)
Nurse with [facility] calls and states yesterday a BMP was ordered and she drew the labs and took to [facility] at 8:30 this morning.  States that pt BP was 176/80 sitting and 160/80 standing. Fasting BS was 301.  Pt swelling in legs is worse- increased to 1 1/2 cm on right leg and increased by 2cm on left leg.  BUN the other day she thinks was 60

## 2013-04-10 NOTE — Telephone Encounter (Signed)
Unfortunately based on his labs from this morning when cannot go up on his diuretic. In fact his creatinine has gone from 3.1-3.2 and his BUN has increased from 60-66. blood sugar at the lab was 234. If he weighing himself daily? If he is felt like to know how much weight he has gained. If he gains another 2 pounds by tomorrow then he will need to go to the emergency room. I had that he is on Lantus 30 units on at bedtime. If this is accurate then increase to 34 units. If he is on different days then please let me know and I can adjust it from there. H will need a repeat CMP on Monday.

## 2013-04-10 NOTE — Telephone Encounter (Signed)
LMOM to return call. Kimberly Gordon, LPN  

## 2013-04-15 ENCOUNTER — Encounter: Payer: Self-pay | Admitting: *Deleted

## 2013-04-17 ENCOUNTER — Other Ambulatory Visit: Payer: Self-pay

## 2013-04-21 ENCOUNTER — Encounter: Payer: Self-pay | Admitting: Physician Assistant

## 2013-04-21 ENCOUNTER — Encounter: Payer: Self-pay | Admitting: Family Medicine

## 2013-11-14 ENCOUNTER — Encounter: Payer: Self-pay | Admitting: Family Medicine

## 2013-11-18 ENCOUNTER — Other Ambulatory Visit: Payer: Self-pay | Admitting: *Deleted

## 2013-11-18 MED ORDER — BUMETANIDE 2 MG PO TABS: 2.0000 mg | ORAL_TABLET | Freq: Every day | ORAL | Status: DC

## 2013-12-30 ENCOUNTER — Encounter: Payer: Self-pay | Admitting: *Deleted

## 2014-01-27 ENCOUNTER — Telehealth: Payer: Self-pay | Admitting: *Deleted

## 2014-01-27 ENCOUNTER — Other Ambulatory Visit: Payer: Self-pay | Admitting: *Deleted

## 2014-01-27 NOTE — Telephone Encounter (Signed)
Before I call I know they are gonna want to know what specifically do you want them to treat it with and how many times a day

## 2014-01-27 NOTE — Telephone Encounter (Signed)
Note closed in error.Troy Barnes L Kamel Haven

## 2014-01-27 NOTE — Telephone Encounter (Signed)
Ok for orders to debride and treat.

## 2014-01-27 NOTE — Telephone Encounter (Signed)
Nurse with [facility] was out seeing patient in the home yesterday and noticed he has a black eschar area on left heel measuring 1 x 3cm.  Would like orders for treatment. Barry DienesKimberly Liann Spaeth, LPN

## 2014-01-27 NOTE — Telephone Encounter (Signed)
She will be going out to pt's home between 10-11 am and would like to know what exactly you would like to have done. Please advise.Deno Etienneonya L Tannia Contino

## 2014-01-28 NOTE — Telephone Encounter (Signed)
I can't really make a rec without seeing it especially is has an eschar. WE can refer him to wound care of if they have a wound care nurse then we can certainly place a referral.

## 2014-01-28 NOTE — Telephone Encounter (Signed)
Called and informed Deanna and she will place orders for wound care nurse.Deno Etienneonya L Kynsie Falkner

## 2014-02-20 ENCOUNTER — Encounter: Payer: Self-pay | Admitting: Physician Assistant

## 2014-02-20 ENCOUNTER — Ambulatory Visit (INDEPENDENT_AMBULATORY_CARE_PROVIDER_SITE_OTHER): Payer: Medicaid Other | Admitting: Physician Assistant

## 2014-02-20 VITALS — BP 186/91 | HR 78 | Wt 206.0 lb

## 2014-02-20 DIAGNOSIS — E119 Type 2 diabetes mellitus without complications: Secondary | ICD-10-CM

## 2014-02-20 DIAGNOSIS — E11359 Type 2 diabetes mellitus with proliferative diabetic retinopathy without macular edema: Secondary | ICD-10-CM

## 2014-02-20 DIAGNOSIS — E1140 Type 2 diabetes mellitus with diabetic neuropathy, unspecified: Secondary | ICD-10-CM

## 2014-02-20 DIAGNOSIS — E11311 Type 2 diabetes mellitus with unspecified diabetic retinopathy with macular edema: Secondary | ICD-10-CM

## 2014-02-20 DIAGNOSIS — N184 Chronic kidney disease, stage 4 (severe): Secondary | ICD-10-CM

## 2014-02-20 DIAGNOSIS — E1149 Type 2 diabetes mellitus with other diabetic neurological complication: Secondary | ICD-10-CM

## 2014-02-20 DIAGNOSIS — E1142 Type 2 diabetes mellitus with diabetic polyneuropathy: Secondary | ICD-10-CM

## 2014-02-20 DIAGNOSIS — E113599 Type 2 diabetes mellitus with proliferative diabetic retinopathy without macular edema, unspecified eye: Secondary | ICD-10-CM

## 2014-02-20 DIAGNOSIS — E11319 Type 2 diabetes mellitus with unspecified diabetic retinopathy without macular edema: Secondary | ICD-10-CM

## 2014-02-20 DIAGNOSIS — E1129 Type 2 diabetes mellitus with other diabetic kidney complication: Secondary | ICD-10-CM

## 2014-02-20 DIAGNOSIS — E1139 Type 2 diabetes mellitus with other diabetic ophthalmic complication: Secondary | ICD-10-CM

## 2014-02-20 LAB — POCT GLYCOSYLATED HEMOGLOBIN (HGB A1C): Hemoglobin A1C: 6.8

## 2014-02-23 NOTE — Progress Notes (Signed)
   Subjective:    Patient ID: Troy Barnes, male    DOB: 1955/12/20, 58 y.o.   MRN: [id number]  HPI Pt is a 58 yo male who presents to the clinic with Diabetes and complications, Stage 4 chronic renal disease to follow up from hospital visit on March 22nd. Pt's kidneys were failing. He was started on dialysis and has been improving since. He feels much better today. Since dialysis his diabetes has been improving. He is only on lantus 20 units at bedtime. No meal time insulin. He is not checking his sugars partly because he cannot see to do it. Pt is already blind and has no feeling in bilateral feet. He continues to struggle to pay for specialist. He restricts his fluids to 32 oz a day. He is being weighed 3 times a week. He reports to be feeling the best he has in a long time. He does stay at home and his wife takes care of him with assistance from a nurse who checks in on him.    Review of Systems     Objective:   Physical Exam  Constitutional: He is oriented to person, place, and time. He appears well-developed and well-nourished.  In a wheel chair  HENT:  Head: Normocephalic and atraumatic.  Cardiovascular: Normal rate, regular rhythm and normal heart sounds.   Pulmonary/Chest: Effort normal and breath sounds normal.  Neurological: He is alert and oriented to person, place, and time.  Psychiatric: He has a normal mood and affect. His behavior is normal.          Assessment & Plan:  HTN- not taken BP medications today. If he takes them he will have severe diarrhea. Nephrologist is managing medications at this point. When he takes medication BP is normal and home and when he goes to dialysis.   Chronic kidney disease stage 4- continue with dialysis 3 times a week and close management by nephrology. Dr. Charlies SilversGreenwood.  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 A1C is 6.8 today on only Lantus at bedtime. pneumococcal shot given in hospital. Follow up in 3 months.     Foot exam - no feeling.   Pt still cannot afford colonoscopy and other health maintenance items.

## 2014-02-27 ENCOUNTER — Telehealth: Payer: Self-pay

## 2014-02-27 NOTE — Telephone Encounter (Signed)
Patient called and left a message on nurse line asking for a return call about medication refill.   Returned Call: Left message asking patient to call back.

## 2014-03-03 ENCOUNTER — Other Ambulatory Visit: Payer: Self-pay | Admitting: *Deleted

## 2014-03-03 MED ORDER — ALPRAZOLAM 0.5 MG PO TABS: 0.5000 mg | ORAL_TABLET | Freq: Three times a day (TID) | ORAL | Status: AC | PRN

## 2014-03-04 ENCOUNTER — Other Ambulatory Visit: Payer: Self-pay | Admitting: *Deleted

## 2014-03-04 MED ORDER — ISOSORBIDE MONONITRATE ER 30 MG PO TB24: 30.0000 mg | ORAL_TABLET | Freq: Every day | ORAL | Status: AC

## 2014-04-23 ENCOUNTER — Telehealth: Payer: Self-pay | Admitting: *Deleted

## 2014-04-23 NOTE — Telephone Encounter (Signed)
Paul DykesDoreen called this morning to let you know that Casimiro NeedleMichael passed away on July 14.

## 2014-05-09 DEATH — deceased
